# Patient Record
Sex: Female | Born: 1990 | Race: White | Hispanic: No | Marital: Married | Smoking: Current every day smoker
Health system: Southern US, Community
[De-identification: ages and names within clinical notes are randomized; demographics above are authoritative.]

## PROBLEM LIST (undated history)

## (undated) DIAGNOSIS — F32A Depression, unspecified: Secondary | ICD-10-CM

## (undated) DIAGNOSIS — Z3A40 40 weeks gestation of pregnancy: Secondary | ICD-10-CM

## (undated) HISTORY — DX: Depression, unspecified: F32.A

## (undated) HISTORY — PX: NO PAST SURGERIES: SHX2092

## (undated) HISTORY — DX: 40 weeks gestation of pregnancy: Z3A.40

---

## 2020-01-24 NOTE — L&D Delivery Note (Signed)
PREOPERATIVE DIAGNOSES: 1. Term pregnancy at [redacted]wks Gestational Age 30. Maternal Exhaustion    POSTOPERATIVE DIAGNOSES: 1. Term pregnancy at [redacted]wks Gestational Age 30. Live, Viable female infant 3. Maternal Exhaustion    OPERATION PERFORMED: Vacuum-Assisted Vaginal Delivery Repair partial  (<50%) 3rd degree laceration   SURGEON: Annamarie Major, MD   ANESTHESIA: Epidural   ESTIMATED BLOOD LOSS: 400 cc   FINDINGS: Delivered a female infant with Apgars 8 and 9, three-vessel cord and normal placenta.   COMPLICATIONS: None   SITUATION:  The options for labor management at his time were discussed with the patient and her partner, as were the delivery options including a Kiwi vacuum delivery. The pros and cons and the risks of the vacuum delivery were discussed in detail, as were the alternative approaches. The patient and her partner decided to proceed with a Kiwi vacuum delivery.    DESCRIPTION OF THE PROCEDURE:    The baby's head was confirmed to be in the ROA presentation with 100% effacement and +3 station. The bladder was drained. The vacuum was placed and the correct placement in front of the posterior fontanelle was confirmed digitally. With the patient's next contraction, the vacuum was inflated and a gentle pressure was used to assist with maternal pushes to deliver the baby's head. In total there were 1pulls and 0 pop-offs.   After delivery of the head, the vacuum was deflated and removed. There was no nuchal cord.  The anterior  shoulder was delivered with gentle downward guidance followed by delivery of the posterior  shoulder with gentle upward guidance. The infant was placed on the maternal chest.  The cord was clamped x2 and cut. The infant was handed to the NICU attendants.   Pitocin was added to the patient's IV fluids. The placenta delivered spontaneously, was intact and had a three-vessel cord. A vaginal inspection revealed 3rd (<50%) perineal lacetation. The laceration  was repaired  with #2-0Vicryl suture in a normal fashion with local anesthesia.    At the end of the delivery mom and baby were recovering in stable condition.  Sponge, instrument, and needle counts were correct times two.   Annamarie Major, MD, [facility], [facility] 10/15/2020  1:45 PM

## 2020-02-23 ENCOUNTER — Encounter: Payer: Self-pay | Admitting: Obstetrics and Gynecology

## 2020-02-23 ENCOUNTER — Ambulatory Visit (INDEPENDENT_AMBULATORY_CARE_PROVIDER_SITE_OTHER): Payer: 59 | Admitting: Obstetrics and Gynecology

## 2020-02-23 ENCOUNTER — Other Ambulatory Visit: Payer: Self-pay

## 2020-02-23 ENCOUNTER — Other Ambulatory Visit (HOSPITAL_COMMUNITY)
Admission: RE | Admit: 2020-02-23 | Discharge: 2020-02-23 | Disposition: A | Discharge status: Home / Self Care | Payer: 59 | Source: Ambulatory Visit | Attending: Obstetrics and Gynecology | Admitting: Obstetrics and Gynecology

## 2020-02-23 VITALS — BP 98/52 | HR 73 | Ht 62.0 in | Wt 120.0 lb

## 2020-02-23 DIAGNOSIS — Z124 Encounter for screening for malignant neoplasm of cervix: Secondary | ICD-10-CM | POA: Insufficient documentation

## 2020-02-23 DIAGNOSIS — Z7185 Encounter for immunization safety counseling: Secondary | ICD-10-CM

## 2020-02-23 DIAGNOSIS — N926 Irregular menstruation, unspecified: Secondary | ICD-10-CM | POA: Diagnosis not present

## 2020-02-23 DIAGNOSIS — Z3403 Encounter for supervision of normal first pregnancy, third trimester: Secondary | ICD-10-CM

## 2020-02-23 DIAGNOSIS — Z113 Encounter for screening for infections with a predominantly sexual mode of transmission: Secondary | ICD-10-CM

## 2020-02-23 DIAGNOSIS — O281 Abnormal biochemical finding on antenatal screening of mother: Secondary | ICD-10-CM

## 2020-02-23 DIAGNOSIS — Z3401 Encounter for supervision of normal first pregnancy, first trimester: Secondary | ICD-10-CM

## 2020-02-23 DIAGNOSIS — Z369 Encounter for antenatal screening, unspecified: Secondary | ICD-10-CM

## 2020-02-23 HISTORY — DX: Encounter for supervision of normal first pregnancy, third trimester: Z34.03

## 2020-02-23 LAB — POCT URINE PREGNANCY: Preg Test, Ur: POSITIVE — AB

## 2020-02-23 NOTE — Progress Notes (Signed)
New Obstetric Patient H&P    Chief Complaint: Missed period, + home pregnancy test   History of Present Illness: Patient is a 30 y.o. G1P0 Not Hispanic or Latino female, presents with amenorrhea and positive home pregnancy test. Patient's last menstrual period was 01/08/2020 (exact date). and based on her  LMP, her EDD is Estimated Date of Delivery: 10/14/20 and her EGA is [redacted]w[redacted]d. Cycles are 6 days, regular, and occur approximately every : 28-30 days. She has never had a pap smear before.   She had a urine pregnancy test which was positive 2 or 3 week(s)  ago. Her last menstrual period was normal and lasted for  6 or 7 day(s). Since her LMP she claims she has experienced fatigue. cramps and breast tenderness. She denies vaginal bleeding. Her past medical history is noncontributory. This is her first pregnancy.  Since her LMP, she admits to the use of tobacco products  Yes - daily vaping - has cut nicotine out She claims she has gained 8 pounds since the start of her pregnancy.  There are cats in the home in the home  yes If yes Indoor - educated regarding litterbox changing - patient stated understanding She admits close contact with children on a regular basis  yes  She has had chicken pox in the past no She has had Tuberculosis exposures, symptoms, or previously tested positive for TB   no Current or past history of domestic violence. no  Genetic Screening/Teratology Counseling: (Includes patient, baby's father, or anyone in either family with:)   1. Patient's age >/= 100 at [facility]  no 2. Thalassemia ([place], [place], Mediterranean, or Asian background): MCV<80  no 3. Neural tube defect (meningomyelocele, spina bifida, anencephaly)  no 4. Congenital heart defect  no  5. Down syndrome  no 6. Tay-Sachs (Jewish, [place]  no 7. Canavan's Disease  no 8. Sickle cell disease or trait (African)  no  9. Hemophilia or other blood disorders  no  10. Muscular dystrophy  no  11. Cystic  fibrosis  no  12. Huntington's Chorea  no  13. Mental retardation/autism  no 14. Other inherited genetic or chromosomal disorder  no 15. Maternal metabolic disorder (DM, PKU, etc)  no 16. Patient or FOB with a child with a birth defect not listed above no  16a. Patient or FOB with a birth defect themselves no 17. Recurrent pregnancy loss, or stillbirth  no  18. Any medications since LMP other than prenatal vitamins (include vitamins, supplements, OTC meds, drugs, alcohol)  no 19. Any other genetic/environmental exposure to discuss  no  Infection History:   1. Lives with someone with TB or TB exposed  no  2. Patient or partner has history of genital herpes  no 3. Rash or viral illness since LMP  no 4. History of STI (GC, CT, HPV, syphilis, HIV)  no 5. History of recent travel :  no  Other pertinent information:  Employed at [employer]  Lives with husband, "Patricia Nixon," and his parents.    Review of Systems:10 point review of systems negative unless otherwise noted in HPI  Past Medical History:  Patient Active Problem List   Diagnosis Date Noted  . Encounter for supervision of normal first pregnancy in first trimester 02/23/2020     Nursing Staff Provider  Office Location  [facility] Dating   LMP  Language  English Anatomy US    Flu Vaccine   decline Genetic Screen  NIPS:  Pt confirming insurance coverage  TDaP vaccine    Hgb A1C or  GTT Third trimester :   Rhogam     LAB RESULTS   Feeding Plan  breast Blood Type     Contraception  Antibody    Circumcision  Rubella    Pediatrician   RPR     Support Person  HBsAg     Prenatal Classes  HIV      Varicella   BTL Consent  GBS  (For PCN allergy, check sensitivities)        VBAC Consent  Pap  collected 02/23/20    Hgb Electro   n/a  Covid Vaccine Counseled -declined CF      SMA              Past Surgical History:  History reviewed. No pertinent surgical history.  Gynecologic History: Patient's last menstrual period  was 01/08/2020 (exact date).  Obstetric History: G1P0  Family History:  History reviewed. No pertinent family history.  Social History:  Social History   Socioeconomic History  . Marital status: Married    Spouse name: Not on file  . Number of children: Not on file  . Years of education: Not on file  . Highest education level: Not on file  Occupational History  . Not on file  Tobacco Use  . Smoking status: Not on file  . Smokeless tobacco: Not on file  Substance and Sexual Activity  . Alcohol use: Not on file  . Drug use: Not on file  . Sexual activity: Not on file  Other Topics Concern  . Not on file  Social History Narrative  . Not on file   Social Determinants of Health   Financial Resource Strain: Not on file  Food Insecurity: Not on file  Transportation Needs: Not on file  Physical Activity: Not on file  Stress: Not on file  Social Connections: Not on file  Intimate Partner Violence: Not on file    Allergies:  No Known Allergies  Medications: Prior to Admission medications   Medication Sig Start Date End Date Taking? Authorizing Provider  Prenatal Vit-Fe Fumarate-FA (MULTIVITAMIN-PRENATAL) 27-0.8 MG TABS tablet Take 1 tablet by mouth daily at 12 noon.   Yes [provider]    Physical Exam Vitals: Blood pressure (!) 98/52, pulse 73, height 5\' 2"  (1.575 m), weight 120 lb (54.4 kg), last menstrual period 01/08/2020.  General: NAD HEENT: normocephalic, anicteric Thyroid: no enlargement, no palpable nodules Pulmonary: No increased work of breathing, CTAB Cardiovascular: RRR, distal pulses 2+ Abdomen: NABS, soft, non-tender, non-distended.  Umbilicus without lesions.  No hepatomegaly, splenomegaly or masses palpable. No evidence of hernia  Genitourinary:  External: Normal external female genitalia.  Normal urethral meatus, normal  Bartholin's and Skene's glands.    Vagina: Normal vaginal mucosa, no evidence of prolapse.    Cervix: Grossly normal  in appearance, no bleeding  Uterus: Enlarged (size c/w with 6-8 week dates), mobile, normal contour.  No CMT  Adnexa: ovaries non-enlarged, no adnexal masses  Rectal: deferred Extremities: no edema, erythema, or tenderness Neurologic: Grossly intact Psychiatric: mood appropriate, affect full   Assessment: 31 y.o. G1P0 at [redacted]w[redacted]d presenting to initiate prenatal care  Plan: 1) Avoid alcoholic beverages. 2) Patient encouraged not to smoke.  3) Discontinue the use of all non-medicinal drugs and chemicals.  4) Take prenatal vitamins daily.  5) Nutrition, food safety (fish, cheese advisories, and high nitrite foods) and exercise discussed. 6) Hospital and practice style discussed with cross coverage system.  7)  Genetic Screening, such as with 1st Trimester Screening, cell free fetal DNA, AFP testing, and Ultrasound, as well as with amniocentesis and CVS as appropriate, is discussed with patient. At the conclusion of today's visit patient plans to reach out to insurance regarding genetic testing coverage  8) NOB labs/urine/pap/gc/ct collected today  9) RTC in 2 weeks for ROB with dating [place]   Patricia Nixon, CNM, MSN [facility], [facility] 02/23/2020, 3:53 PM

## 2020-02-24 LAB — URINE DRUG PANEL 7
Amphetamines, Urine: NEGATIVE ng/mL
Barbiturate Quant, Ur: NEGATIVE ng/mL
Benzodiazepine Quant, Ur: NEGATIVE ng/mL
Cannabinoid Quant, Ur: NEGATIVE ng/mL
Cocaine (Metab.): NEGATIVE ng/mL
Opiate Quant, Ur: NEGATIVE ng/mL
PCP Quant, Ur: NEGATIVE ng/mL

## 2020-02-25 LAB — RPR+RH+ABO+RUB AB+AB SCR+CB...
Antibody Screen: NEGATIVE
HIV Screen 4th Generation wRfx: NONREACTIVE
Hematocrit: 33.2 % — ABNORMAL LOW (ref 34.0–46.6)
Hemoglobin: 11.5 g/dL (ref 11.1–15.9)
Hepatitis B Surface Ag: NEGATIVE
MCH: 31.7 pg (ref 26.6–33.0)
MCHC: 34.6 g/dL (ref 31.5–35.7)
MCV: 92 fL (ref 79–97)
Platelets: 325 10*3/uL (ref 150–450)
RBC: 3.63 x10E6/uL — ABNORMAL LOW (ref 3.77–5.28)
RDW: 13 % (ref 11.7–15.4)
RPR Ser Ql: NONREACTIVE
Rh Factor: POSITIVE
Rubella Antibodies, IGG: 1.35 {index}
Varicella zoster IgG: 240 {index}
WBC: 10.6 10*3/uL (ref 3.4–10.8)

## 2020-02-25 LAB — URINE CULTURE

## 2020-02-26 LAB — CYTOLOGY - PAP
Adequacy: ABSENT
Chlamydia: NEGATIVE
Comment: NEGATIVE
Comment: NEGATIVE
Comment: NORMAL
Diagnosis: NEGATIVE
High risk HPV: NEGATIVE
Neisseria Gonorrhea: NEGATIVE

## 2020-03-02 ENCOUNTER — Ambulatory Visit (INDEPENDENT_AMBULATORY_CARE_PROVIDER_SITE_OTHER): Payer: 59 | Admitting: Obstetrics

## 2020-03-02 ENCOUNTER — Other Ambulatory Visit: Payer: Self-pay

## 2020-03-02 ENCOUNTER — Ambulatory Visit (INDEPENDENT_AMBULATORY_CARE_PROVIDER_SITE_OTHER): Payer: 59

## 2020-03-02 ENCOUNTER — Other Ambulatory Visit: Payer: Self-pay | Admitting: Obstetrics and Gynecology

## 2020-03-02 VITALS — BP 102/60 | Wt 120.0 lb

## 2020-03-02 DIAGNOSIS — Z3A01 Less than 8 weeks gestation of pregnancy: Secondary | ICD-10-CM

## 2020-03-02 DIAGNOSIS — N926 Irregular menstruation, unspecified: Secondary | ICD-10-CM | POA: Diagnosis not present

## 2020-03-02 DIAGNOSIS — Z3401 Encounter for supervision of normal first pregnancy, first trimester: Secondary | ICD-10-CM

## 2020-03-02 NOTE — Progress Notes (Signed)
No vb. No lof.  

## 2020-03-02 NOTE — Progress Notes (Signed)
  Routine Prenatal Care Visit  Subjective  Patricia Nixon is a 30 y.o. G1P0 at [redacted]w[redacted]d being seen today for ongoing prenatal care.  She is currently monitored for the following issues for this low-risk pregnancy and has Encounter for supervision of normal first pregnancy in first trimester on their problem list.  ----------------------------------------------------------------------------------- Patient reports no complaints.    . Vag. Bleeding: None.   . Leaking Fluid denies.  ----------------------------------------------------------------------------------- The following portions of the patient's history were reviewed and updated as appropriate: allergies, current medications, past family history, past medical history, past social history, past surgical history and problem list. Problem list updated.  Objective  Blood pressure 102/60, weight 120 lb (54.4 kg), last menstrual period 01/08/2020. Pregravid weight 112 lb (50.8 kg) Total Weight Gain 8 lb (3.629 kg) Urinalysis: Urine Protein    Urine Glucose    Fetal Status:           General:  Alert, oriented and cooperative. Patient is in no acute distress.  Skin: Skin is warm and dry. No rash noted.   Cardiovascular: Normal heart rate noted  Respiratory: Normal respiratory effort, no problems with respiration noted  Abdomen: Soft, gravid, appropriate for gestational age. Pain/Pressure: Absent     Pelvic:  Cervical exam deferred        Extremities: Normal range of motion.     Mental Status: Normal mood and affect. Normal behavior. Normal judgment and thought content.   Assessment   30 y.o. G1P0 at [redacted]w[redacted]d by  10/14/2020, by Last Menstrual Period presenting for routine prenatal visit  Plan   pregnancy 1 Problems (from 02/23/20 to present)    Problem Noted Resolved   Encounter for supervision of normal first pregnancy in first trimester 02/23/2020 by Zipporah Plants, CNM No   Overview Addendum 02/25/2020  2:25 PM by Zipporah Plants, CNM     Nursing  Staff Provider  Office Location  [facility] Dating   LMP  Language  English Anatomy US    Flu Vaccine   decline Genetic Screen  NIPS:  Pt confirming insurance coverage   TDaP vaccine    Hgb A1C or  GTT Third trimester :   Rhogam     LAB RESULTS   Feeding Plan  breast Blood Type   A +  Contraception  Antibody   neg  Circumcision  Rubella   immune  Pediatrician   RPR   non-reactive  Support Person  HBsAg   negative  Prenatal Classes  HIV   non-reactive    Varicella  immune  BTL Consent  GBS  (For PCN allergy, check sensitivities)        VBAC Consent  Pap  collected 02/23/20    Hgb Electro   n/a  Covid Vaccine Counseled -declined CF      SMA                Previous Version       Preterm labor symptoms and general obstetric precautions including but not limited to vaginal bleeding, contractions, leaking of fluid and fetal movement were reviewed in detail with the patient. Please refer to After Visit Summary for other counseling recommendations.   Return in about 4 weeks (around 03/30/2020) for return OB.  Mirna Mires, CNM  03/02/2020 2:11 PM

## 2020-03-30 ENCOUNTER — Other Ambulatory Visit: Payer: Self-pay

## 2020-03-30 ENCOUNTER — Ambulatory Visit (INDEPENDENT_AMBULATORY_CARE_PROVIDER_SITE_OTHER): Payer: 59 | Admitting: Obstetrics

## 2020-03-30 VITALS — BP 118/74 | Wt 128.0 lb

## 2020-03-30 DIAGNOSIS — Z3401 Encounter for supervision of normal first pregnancy, first trimester: Secondary | ICD-10-CM

## 2020-03-30 DIAGNOSIS — Z3A11 11 weeks gestation of pregnancy: Secondary | ICD-10-CM

## 2020-03-30 NOTE — Progress Notes (Signed)
  Routine Prenatal Care Visit  Subjective  Patricia Nixon is a 30 y.o. G1P0 at [redacted]w[redacted]d being seen today for ongoing prenatal care.  She is currently monitored for the following issues for this low-risk pregnancy and has Encounter for supervision of normal first pregnancy in first trimester on their problem list.  ----------------------------------------------------------------------------------- Patient reports no complaints.    . Vag. Bleeding: None.   . Leaking Fluid denies.  ----------------------------------------------------------------------------------- The following portions of the patient's history were reviewed and updated as appropriate: allergies, current medications, past family history, past medical history, past social history, past surgical history and problem list. Problem list updated.  Objective  Blood pressure 118/74, weight 128 lb (58.1 kg), last menstrual period 01/08/2020. Pregravid weight 112 lb (50.8 kg) Total Weight Gain 16 lb (7.258 kg) Urinalysis: Urine Protein    Urine Glucose    Fetal Status:           General:  Alert, oriented and cooperative. Patient is in no acute distress.  Skin: Skin is warm and dry. No rash noted.   Cardiovascular: Normal heart rate noted  Respiratory: Normal respiratory effort, no problems with respiration noted  Abdomen: Soft, gravid, appropriate for gestational age. Pain/Pressure: Absent     Pelvic:  Cervical exam deferred        Extremities: Normal range of motion.  Edema: None  Mental Status: Normal mood and affect. Normal behavior. Normal judgment and thought content.   Assessment   30 y.o. G1P0 at [redacted]w[redacted]d by  10/14/2020, by Last Menstrual Period presenting for routine prenatal visit  Plan   pregnancy 1 Problems (from 02/23/20 to present)    Problem Noted Resolved   Encounter for supervision of normal first pregnancy in first trimester 02/23/2020 by Patricia Nixon, CNM No   Overview Addendum 02/25/2020  2:25 PM by Patricia Nixon, CNM      Nursing Staff Provider  Office Location  [facility] Dating   LMP  Language  English Anatomy US    Flu Vaccine   decline Genetic Screen  NIPS:  Pt confirming insurance coverage   TDaP vaccine    Hgb A1C or  GTT Third trimester :   Rhogam     LAB RESULTS   Feeding Plan  breast Blood Type   A +  Contraception  Antibody   neg  Circumcision  Rubella   immune  Pediatrician   RPR   non-reactive  Support Person  HBsAg   negative  Prenatal Classes  HIV   non-reactive    Varicella  immune  BTL Consent  GBS  (For PCN allergy, check sensitivities)        VBAC Consent  Pap  collected 02/23/20    Hgb Electro   n/a  Covid Vaccine Counseled -declined CF      SMA                Previous Version       Preterm labor symptoms and general obstetric precautions including but not limited to vaginal bleeding, contractions, leaking of fluid and fetal movement were reviewed in detail with the patient. Please refer to After Visit Summary for other counseling recommendations.  She has decided not to proced with genetic testing.  Return in about 4 weeks (around 04/27/2020) for return OB.  Patricia Nixon, CNM  03/30/2020 3:23 PM

## 2020-03-30 NOTE — Progress Notes (Signed)
No vb. No lof.  

## 2020-04-27 ENCOUNTER — Ambulatory Visit (INDEPENDENT_AMBULATORY_CARE_PROVIDER_SITE_OTHER): Payer: 59 | Admitting: Obstetrics & Gynecology

## 2020-04-27 ENCOUNTER — Other Ambulatory Visit: Payer: Self-pay

## 2020-04-27 ENCOUNTER — Encounter: Payer: Self-pay | Admitting: Obstetrics & Gynecology

## 2020-04-27 VITALS — BP 100/70 | Wt 132.0 lb

## 2020-04-27 DIAGNOSIS — Z3402 Encounter for supervision of normal first pregnancy, second trimester: Secondary | ICD-10-CM

## 2020-04-27 DIAGNOSIS — Z3A15 15 weeks gestation of pregnancy: Secondary | ICD-10-CM

## 2020-04-27 DIAGNOSIS — Z3689 Encounter for other specified antenatal screening: Secondary | ICD-10-CM

## 2020-04-27 NOTE — Progress Notes (Signed)
  Subjective  Min nausea or pain  Objective  BP 100/70   Wt 132 lb (59.9 kg)   LMP 01/08/2020 (Exact Date)   BMI 24.14 kg/m  General: NAD Pumonary: no increased work of breathing Abdomen: gravid, non-tender Extremities: no edema Psychiatric: mood appropriate, affect full  Assessment  30 y.o. G1P0 at [redacted]w[redacted]d by  10/14/2020, by Last Menstrual Period presenting for routine prenatal visit  Plan   Problem List Items Addressed This Visit      Other   Encounter for supervision of normal first pregnancy in first trimester - Primary    Other Visit Diagnoses    [redacted] weeks gestation of pregnancy          pregnancy 1 Problems (from 02/23/20 to present)    Problem Noted Resolved   Encounter for supervision of normal first pregnancy in first trimester 02/23/2020 by Zipporah Plants, CNM No   Overview Addendum 04/27/2020 10:23 AM by Nadara Mustard, MD     Nursing Staff Provider  Office Location  [facility] Dating   LMP  Language  English Anatomy US  scheduled  Flu Vaccine   decline Genetic Screen  NIPS: decline   TDaP vaccine    Hgb A1C or  GTT Third trimester :   Rhogam   n/a   LAB RESULTS   Feeding Plan  breast Blood Type   A +  Contraception  Antibody   neg  Circumcision  Rubella   immune  Pediatrician   RPR   non-reactive  Support Person  HBsAg   negative  Prenatal Classes  HIV   non-reactive    Varicella  immune  BTL Consent no GBS  (For PCN allergy, check sensitivities)        VBAC Consent n/a Pap  collected 02/23/20    Hgb Electro   n/a  Covid Vaccine Counseled -declined                     Annamarie Major, MD, [facility], [facility] 04/27/2020  10:35 AM

## 2020-04-27 NOTE — Patient Instructions (Signed)
Thank you for choosing [facility]. As part of our ongoing efforts to improve patient experience, we would appreciate your feedback. Please fill out the short survey that you will receive by mail or MyChart. Your opinion is important to us! -Dr Yanira Tolsma  Second Trimester of Pregnancy  The second trimester of pregnancy is from week 13 through week 27. This is months 4 through 6 of pregnancy. The second trimester is often a time when you feel your best. Your body has adjusted to being pregnant, and you begin to feel better physically. During the second trimester:  Morning sickness has lessened or stopped completely.  You may have more energy.  You may have an increase in appetite. The second trimester is also a time when the unborn baby (fetus) is growing rapidly. At the end of the sixth month, the fetus may be up to 12 inches long and weigh about 1 pounds. You will likely begin to feel the baby move (quickening) between 16 and 20 weeks of pregnancy. Body changes during your second trimester Your body continues to go through many changes during your second trimester. The changes vary and generally return to normal after the baby is born. Physical changes  Your weight will continue to increase. You will notice your lower abdomen bulging out.  You may begin to get stretch marks on your hips, abdomen, and breasts.  Your breasts will continue to grow and to become tender.  Dark spots or blotches (chloasma or mask of pregnancy) may develop on your face.  A dark line from your belly button to the pubic area (linea nigra) may appear.  You may have changes in your hair. These can include thickening of your hair, rapid growth, and changes in texture. Some people also have hair loss during or after pregnancy, or hair that feels dry or thin. Health changes  You may develop headaches.  You may have heartburn.  You may develop constipation.  You may develop hemorrhoids or swollen, bulging  veins (varicose veins).  Your gums may bleed and may be sensitive to brushing and flossing.  You may urinate more often because the fetus is pressing on your bladder.  You may have back pain. This is caused by: ? Weight gain. ? Pregnancy hormones that are relaxing the joints in your pelvis. ? A shift in weight and the muscles that support your balance. Follow these instructions at home: Medicines  Follow your health care provider's instructions regarding medicine use. Specific medicines may be either safe or unsafe to take during pregnancy. Do not take any medicines unless approved by your health care provider.  Take a prenatal vitamin that contains at least 600 micrograms (mcg) of folic acid. Eating and drinking  Eat a healthy diet that includes fresh fruits and vegetables, whole grains, good sources of protein such as meat, eggs, or tofu, and low-fat dairy products.  Avoid raw meat and unpasteurized juice, milk, and cheese. These carry germs that can harm you and your baby.  You may need to take these actions to prevent or treat constipation: ? Drink enough fluid to keep your urine pale yellow. ? Eat foods that are high in fiber, such as beans, whole grains, and fresh fruits and vegetables. ? Limit foods that are high in fat and processed sugars, such as fried or sweet foods. Activity  Exercise only as directed by your health care provider. Most people can continue their usual exercise routine during pregnancy. Try to exercise for 30 minutes at least   5 days a week. Stop exercising if you develop contractions in your uterus.  Stop exercising if you develop pain or cramping in the lower abdomen or lower back.  Avoid exercising if it is very hot or humid or if you are at a high altitude.  Avoid heavy lifting.  If you choose to, you may have sex unless your health care provider tells you not to. Relieving pain and discomfort  Wear a supportive bra to prevent discomfort from  breast tenderness.  Take warm sitz baths to soothe any pain or discomfort caused by hemorrhoids. Use hemorrhoid cream if your health care provider approves.  Rest with your legs raised (elevated) if you have leg cramps or low back pain.  If you develop varicose veins: ? Wear support hose as told by your health care provider. ? Elevate your feet for 15 minutes, 3-4 times a day. ? Limit salt in your diet. Safety  Wear your seat belt at all times when driving or riding in a car.  Talk with your health care provider if someone is verbally or physically abusive to you. Lifestyle  Do not use hot tubs, steam rooms, or saunas.  Do not douche. Do not use tampons or scented sanitary pads.  Avoid cat litter boxes and soil used by cats. These carry germs that can cause birth defects in the baby and possibly loss of the fetus by miscarriage or stillbirth.  Do not use herbal remedies, alcohol, illegal drugs, or medicines that are not approved by your health care provider. Chemicals in these products can harm your baby.  Do not use any products that contain nicotine or tobacco, such as cigarettes, e-cigarettes, and chewing tobacco. If you need help quitting, ask your health care provider. General instructions  During a routine prenatal visit, your health care provider will do a physical exam and other tests. He or she will also discuss your overall health. Keep all follow-up visits. This is important.  Ask your health care provider for a referral to a local prenatal education class.  Ask for help if you have counseling or nutritional needs during pregnancy. Your health care provider can offer advice or refer you to specialists for help with various needs. Where to find more information  American Pregnancy Association: americanpregnancy.org  American College of Obstetricians and Gynecologists: acog.org/en/Womens%20Health/Pregnancy  Office on Women's Health: womenshealth.gov/pregnancy Contact  a health care provider if you have:  A headache that does not go away when you take medicine.  Vision changes or you see spots in front of your eyes.  Mild pelvic cramps, pelvic pressure, or nagging pain in the abdominal area.  Persistent nausea, vomiting, or diarrhea.  A bad-smelling vaginal discharge or foul-smelling urine.  Pain when you urinate.  Sudden or extreme swelling of your face, hands, ankles, feet, or legs.  A fever. Get help right away if you:  Have fluid leaking from your vagina.  Have spotting or bleeding from your vagina.  Have severe abdominal cramping or pain.  Have difficulty breathing.  Have chest pain.  Have fainting spells.  Have not felt your baby move for the time period told by your health care provider.  Have new or increased pain, swelling, or redness in an arm or leg. Summary  The second trimester of pregnancy is from week 13 through week 27 (months 4 through 6).  Do not use herbal remedies, alcohol, illegal drugs, or medicines that are not approved by your health care provider. Chemicals in these products can   harm your baby.  Exercise only as directed by your health care provider. Most people can continue their usual exercise routine during pregnancy.  Keep all follow-up visits. This is important. This information is not intended to replace advice given to you by your health care provider. Make sure you discuss any questions you have with your health care provider. Document Revised: 06/18/2019 Document Reviewed: 04/24/2019 Elsevier Patient Education  2021 Elsevier Inc.  

## 2020-06-02 ENCOUNTER — Other Ambulatory Visit: Payer: Self-pay

## 2020-06-02 ENCOUNTER — Ambulatory Visit
Admission: RE | Admit: 2020-06-02 | Discharge: 2020-06-02 | Disposition: A | Discharge status: Home / Self Care | Payer: Medicaid Other | Source: Ambulatory Visit | Attending: Obstetrics & Gynecology | Admitting: Obstetrics & Gynecology

## 2020-06-02 DIAGNOSIS — Z3689 Encounter for other specified antenatal screening: Secondary | ICD-10-CM | POA: Diagnosis not present

## 2020-06-02 DIAGNOSIS — Z3A21 21 weeks gestation of pregnancy: Secondary | ICD-10-CM | POA: Diagnosis not present

## 2020-06-03 ENCOUNTER — Encounter: Payer: Self-pay | Admitting: Advanced Practice Midwife

## 2020-06-03 ENCOUNTER — Ambulatory Visit (INDEPENDENT_AMBULATORY_CARE_PROVIDER_SITE_OTHER): Payer: Medicaid Other | Admitting: Advanced Practice Midwife

## 2020-06-03 VITALS — BP 120/80 | Wt 135.0 lb

## 2020-06-03 DIAGNOSIS — Z3A21 21 weeks gestation of pregnancy: Secondary | ICD-10-CM

## 2020-06-03 DIAGNOSIS — Z3402 Encounter for supervision of normal first pregnancy, second trimester: Secondary | ICD-10-CM

## 2020-06-03 LAB — POCT URINALYSIS DIPSTICK OB
Glucose, UA: NEGATIVE
POC,PROTEIN,UA: NEGATIVE

## 2020-06-03 NOTE — Progress Notes (Addendum)
  Routine Prenatal Care Visit  Subjective  Patricia Nixon is a 30 y.o. G1P0 at [redacted]w[redacted]d being seen today for ongoing prenatal care.  She is currently monitored for the following issues for this low-risk pregnancy and has Encounter for supervision of normal first pregnancy in first trimester on their problem list.  ----------------------------------------------------------------------------------- Patient reports no complaints.   Contractions: Not present. Vag. Bleeding: None.  Movement: Present. Leaking Fluid denies.  ----------------------------------------------------------------------------------- The following portions of the patient's history were reviewed and updated as appropriate: allergies, current medications, past family history, past medical history, past social history, past surgical history and problem list. Problem list updated.  Objective  Blood pressure 120/80, weight 135 lb (61.2 kg), last menstrual period 01/08/2020. Pregravid weight 112 lb (50.8 kg) Total Weight Gain 23 lb (10.4 kg) Urinalysis: Urine Protein    Urine Glucose    Fetal Status: Fetal Heart Rate (bpm): 153 Fundal Height: 21 cm Movement: Present      Anatomy scan done 06/02/2020: complete, normal, female, cephalic, placenta posterior  General:  Alert, oriented and cooperative. Patient is in no acute distress.  Skin: Skin is warm and dry. No rash noted.   Cardiovascular: Normal heart rate noted  Respiratory: Normal respiratory effort, no problems with respiration noted  Abdomen: Soft, gravid, appropriate for gestational age. Pain/Pressure: Absent     Pelvic:  Cervical exam deferred        Extremities: Normal range of motion.  Edema: None  Mental Status: Normal mood and affect. Normal behavior. Normal judgment and thought content.   Assessment   30 y.o. G1P0 at [redacted]w[redacted]d by  10/14/2020, by Last Menstrual Period presenting for routine prenatal visit  Plan   pregnancy 1 Problems (from 02/23/20 to present)    Problem  Noted Resolved   Encounter for supervision of normal first pregnancy in first trimester 02/23/2020 by Zipporah Plants, CNM No   Overview Addendum 04/27/2020 10:23 AM by Nadara Mustard, MD     Nursing Staff Provider  Office Location  [facility] Dating   LMP  Language  English Anatomy US  scheduled  Flu Vaccine   decline Genetic Screen  NIPS: decline   TDaP vaccine    Hgb A1C or  GTT Third trimester :   Rhogam   n/a   LAB RESULTS   Feeding Plan  breast Blood Type   A +  Contraception  Antibody   neg  Circumcision  Rubella   immune  Pediatrician   RPR   non-reactive  Support Person  HBsAg   negative  Prenatal Classes  HIV   non-reactive    Varicella  immune  BTL Consent no GBS  (For PCN allergy, check sensitivities)        VBAC Consent n/a Pap  collected 02/23/20    Hgb Electro   n/a  Covid Vaccine Counseled -declined                       Previous Version       Preterm labor symptoms and general obstetric precautions including but not limited to vaginal bleeding, contractions, leaking of fluid and fetal movement were reviewed in detail with the patient.    Return in about 4 weeks (around 07/01/2020) for rob.  Tresea Mall, CNM 06/03/2020 4:32 PM

## 2020-07-01 ENCOUNTER — Ambulatory Visit (INDEPENDENT_AMBULATORY_CARE_PROVIDER_SITE_OTHER): Payer: Medicaid Other | Admitting: Advanced Practice Midwife

## 2020-07-01 ENCOUNTER — Encounter: Payer: Self-pay | Admitting: Advanced Practice Midwife

## 2020-07-01 ENCOUNTER — Other Ambulatory Visit: Payer: Self-pay

## 2020-07-01 VITALS — BP 122/76 | Wt 142.0 lb

## 2020-07-01 DIAGNOSIS — Z3A25 25 weeks gestation of pregnancy: Secondary | ICD-10-CM

## 2020-07-01 DIAGNOSIS — Z113 Encounter for screening for infections with a predominantly sexual mode of transmission: Secondary | ICD-10-CM

## 2020-07-01 DIAGNOSIS — Z3402 Encounter for supervision of normal first pregnancy, second trimester: Secondary | ICD-10-CM

## 2020-07-01 DIAGNOSIS — Z131 Encounter for screening for diabetes mellitus: Secondary | ICD-10-CM

## 2020-07-01 DIAGNOSIS — Z369 Encounter for antenatal screening, unspecified: Secondary | ICD-10-CM

## 2020-07-01 DIAGNOSIS — Z130 Encounter for screening for diseases of the blood and blood-forming organs and certain disorders involving the immune mechanism: Secondary | ICD-10-CM

## 2020-07-01 LAB — POCT URINALYSIS DIPSTICK OB
Glucose, UA: NEGATIVE
POC,PROTEIN,UA: NEGATIVE

## 2020-07-01 NOTE — Progress Notes (Signed)
ROB - no concerns. RM 5 

## 2020-07-01 NOTE — Progress Notes (Signed)
  Routine Prenatal Care Visit  Subjective  Patricia Nixon is a 30 y.o. G1P0 at [redacted]w[redacted]d being seen today for ongoing prenatal care.  She is currently monitored for the following issues for this low-risk pregnancy and has Encounter for supervision of normal first pregnancy in first trimester on their problem list.  ----------------------------------------------------------------------------------- Patient reports no complaints.   Contractions: Not present. Vag. Bleeding: None.  Movement: Present. Leaking Fluid denies.  ----------------------------------------------------------------------------------- The following portions of the patient's history were reviewed and updated as appropriate: allergies, current medications, past family history, past medical history, past social history, past surgical history and problem list. Problem list updated.  Objective  Blood pressure 122/76, weight 142 lb (64.4 kg), last menstrual period 01/08/2020. Pregravid weight 112 lb (50.8 kg) Total Weight Gain 30 lb (13.6 kg) Urinalysis: Urine Protein Negative  Urine Glucose Negative  Fetal Status: Fetal Heart Rate (bpm): 146 Fundal Height: 25 cm Movement: Present     General:  Alert, oriented and cooperative. Patient is in no acute distress.  Skin: Skin is warm and dry. No rash noted.   Cardiovascular: Normal heart rate noted  Respiratory: Normal respiratory effort, no problems with respiration noted  Abdomen: Soft, gravid, appropriate for gestational age. Pain/Pressure: Absent     Pelvic:  Cervical exam deferred        Extremities: Normal range of motion.  Edema: None  Mental Status: Normal mood and affect. Normal behavior. Normal judgment and thought content.   Assessment   30 y.o. G1P0 at [redacted]w[redacted]d by  10/14/2020, by Last Menstrual Period presenting for routine prenatal visit  Plan   pregnancy 1 Problems (from 02/23/20 to present)    Problem Noted Resolved   Encounter for supervision of normal first pregnancy in  first trimester 02/23/2020 by Zipporah Plants, CNM No   Overview Addendum 04/27/2020 10:23 AM by Nadara Mustard, MD     Nursing Staff Provider  Office Location  [facility] Dating   LMP  Language  English Anatomy US  scheduled  Flu Vaccine   decline Genetic Screen  NIPS: decline   TDaP vaccine    Hgb A1C or  GTT Third trimester :   Rhogam   n/a   LAB RESULTS   Feeding Plan  breast Blood Type   A +  Contraception  Antibody   neg  Circumcision  Rubella   immune  Pediatrician   RPR   non-reactive  Support Person  HBsAg   negative  Prenatal Classes  HIV   non-reactive    Varicella  immune  BTL Consent no GBS  (For PCN allergy, check sensitivities)        VBAC Consent n/a Pap  collected 02/23/20    Hgb Electro   n/a  Covid Vaccine Counseled -declined                            Preterm labor symptoms and general obstetric precautions including but not limited to vaginal bleeding, contractions, leaking of fluid and fetal movement were reviewed in detail with the patient.   Return in about 3 weeks (around 07/22/2020) for 28 wk labs and rob.  Tresea Mall, CNM 07/01/2020 3:23 PM

## 2020-07-22 ENCOUNTER — Encounter: Payer: Self-pay | Admitting: Advanced Practice Midwife

## 2020-07-22 ENCOUNTER — Ambulatory Visit (INDEPENDENT_AMBULATORY_CARE_PROVIDER_SITE_OTHER): Payer: Medicaid Other | Admitting: Advanced Practice Midwife

## 2020-07-22 ENCOUNTER — Encounter: Payer: Medicaid Other | Admitting: Obstetrics & Gynecology

## 2020-07-22 ENCOUNTER — Other Ambulatory Visit: Payer: Medicaid Other

## 2020-07-22 ENCOUNTER — Other Ambulatory Visit: Payer: Self-pay

## 2020-07-22 VITALS — BP 100/60 | Wt 146.0 lb

## 2020-07-22 DIAGNOSIS — Z369 Encounter for antenatal screening, unspecified: Secondary | ICD-10-CM | POA: Diagnosis not present

## 2020-07-22 DIAGNOSIS — Z130 Encounter for screening for diseases of the blood and blood-forming organs and certain disorders involving the immune mechanism: Secondary | ICD-10-CM

## 2020-07-22 DIAGNOSIS — Z131 Encounter for screening for diabetes mellitus: Secondary | ICD-10-CM

## 2020-07-22 DIAGNOSIS — Z3A28 28 weeks gestation of pregnancy: Secondary | ICD-10-CM

## 2020-07-22 DIAGNOSIS — Z3402 Encounter for supervision of normal first pregnancy, second trimester: Secondary | ICD-10-CM

## 2020-07-22 DIAGNOSIS — Z3403 Encounter for supervision of normal first pregnancy, third trimester: Secondary | ICD-10-CM

## 2020-07-22 DIAGNOSIS — Z113 Encounter for screening for infections with a predominantly sexual mode of transmission: Secondary | ICD-10-CM

## 2020-07-22 LAB — POCT URINALYSIS DIPSTICK OB
Glucose, UA: NEGATIVE
POC,PROTEIN,UA: NEGATIVE

## 2020-07-22 NOTE — Progress Notes (Signed)
ROB- 1 hr GTT, no concerns 

## 2020-07-22 NOTE — Progress Notes (Signed)
  Routine Prenatal Care Visit  Subjective  Patricia Nixon is a 30 y.o. G1P0 at [redacted]w[redacted]d being seen today for ongoing prenatal care.  She is currently monitored for the following issues for this low-risk pregnancy and has Encounter for supervision of normal first pregnancy in first trimester on their problem list.  ----------------------------------------------------------------------------------- Patient reports feeling tired today. She didn't get much sleep due to new puppies in the house.   Contractions: Not present. Vag. Bleeding: None.  Movement: Present. Leaking Fluid denies.  ----------------------------------------------------------------------------------- The following portions of the patient's history were reviewed and updated as appropriate: allergies, current medications, past family history, past medical history, past social history, past surgical history and problem list. Problem list updated.  Objective  Blood pressure 100/60, weight 146 lb (66.2 kg), last menstrual period 01/08/2020. Pregravid weight 112 lb (50.8 kg) Total Weight Gain 34 lb (15.4 kg) Urinalysis: Urine Protein Negative  Urine Glucose Negative  Fetal Status: Fetal Heart Rate (bpm): 140 Fundal Height: 28 cm Movement: Present     General:  Alert, oriented and cooperative. Patient is in no acute distress.  Skin: Skin is warm and dry. No rash noted.   Cardiovascular: Normal heart rate noted  Respiratory: Normal respiratory effort, no problems with respiration noted  Abdomen: Soft, gravid, appropriate for gestational age. Pain/Pressure: Absent     Pelvic:  Cervical exam deferred        Extremities: Normal range of motion.  Edema: None  Mental Status: Normal mood and affect. Normal behavior. Normal judgment and thought content.   Assessment   30 y.o. G1P0 at [redacted]w[redacted]d by  10/14/2020, by Last Menstrual Period presenting for routine prenatal visit  Plan   pregnancy 1 Problems (from 02/23/20 to present)    Problem Noted  Resolved   Encounter for supervision of normal first pregnancy in first trimester 02/23/2020 by Zipporah Plants, CNM No   Overview Addendum 04/27/2020 10:23 AM by Nadara Mustard, MD     Nursing Staff Provider  Office Location  [facility] Dating   LMP  Language  English Anatomy US  scheduled  Flu Vaccine   decline Genetic Screen  NIPS: decline   TDaP vaccine    Hgb A1C or  GTT Third trimester :   Rhogam   n/a   LAB RESULTS   Feeding Plan  breast Blood Type   A +  Contraception  Antibody   neg  Circumcision  Rubella   immune  Pediatrician   RPR   non-reactive  Support Person  HBsAg   negative  Prenatal Classes  HIV   non-reactive    Varicella  immune  BTL Consent no GBS  (For PCN allergy, check sensitivities)        VBAC Consent n/a Pap  collected 02/23/20    Hgb Electro   n/a  Covid Vaccine Counseled -declined                            Preterm labor symptoms and general obstetric precautions including but not limited to vaginal bleeding, contractions, leaking of fluid and fetal movement were reviewed in detail with the patient. Please refer to After Visit Summary for other counseling recommendations.  28 week labs today  Return in about 2 weeks (around 08/05/2020) for rob.  Tresea Mall, CNM 07/22/2020 2:56 PM

## 2020-07-22 NOTE — Patient Instructions (Signed)

## 2020-07-23 ENCOUNTER — Other Ambulatory Visit: Payer: Self-pay | Admitting: Advanced Practice Midwife

## 2020-07-23 DIAGNOSIS — Z369 Encounter for antenatal screening, unspecified: Secondary | ICD-10-CM

## 2020-07-23 DIAGNOSIS — Z3402 Encounter for supervision of normal first pregnancy, second trimester: Secondary | ICD-10-CM

## 2020-07-23 DIAGNOSIS — R7309 Other abnormal glucose: Secondary | ICD-10-CM

## 2020-07-23 LAB — 28 WEEK RH+PANEL
Basophils Absolute: 0 10*3/uL (ref 0.0–0.2)
Basos: 0 %
EOS (ABSOLUTE): 0.2 10*3/uL (ref 0.0–0.4)
Eos: 2 %
Gestational Diabetes Screen: 166 mg/dL — ABNORMAL HIGH (ref 65–139)
HIV Screen 4th Generation wRfx: NONREACTIVE
Hematocrit: 31.4 % — ABNORMAL LOW (ref 34.0–46.6)
Hemoglobin: 10.7 g/dL — ABNORMAL LOW (ref 11.1–15.9)
Immature Grans (Abs): 0.1 10*3/uL (ref 0.0–0.1)
Immature Granulocytes: 1 %
Lymphocytes Absolute: 1.5 10*3/uL (ref 0.7–3.1)
Lymphs: 15 %
MCH: 33 pg (ref 26.6–33.0)
MCHC: 34.1 g/dL (ref 31.5–35.7)
MCV: 97 fL (ref 79–97)
Monocytes Absolute: 0.9 10*3/uL (ref 0.1–0.9)
Monocytes: 9 %
Neutrophils Absolute: 7.4 10*3/uL — ABNORMAL HIGH (ref 1.4–7.0)
Neutrophils: 73 %
Platelets: 199 10*3/uL (ref 150–450)
RBC: 3.24 x10E6/uL — ABNORMAL LOW (ref 3.77–5.28)
RDW: 12.7 % (ref 11.7–15.4)
RPR Ser Ql: NONREACTIVE
WBC: 10.2 10*3/uL (ref 3.4–10.8)

## 2020-07-29 ENCOUNTER — Other Ambulatory Visit: Payer: Self-pay

## 2020-07-29 ENCOUNTER — Other Ambulatory Visit: Payer: Medicaid Other

## 2020-07-29 DIAGNOSIS — Z369 Encounter for antenatal screening, unspecified: Secondary | ICD-10-CM | POA: Diagnosis not present

## 2020-07-29 DIAGNOSIS — Z3402 Encounter for supervision of normal first pregnancy, second trimester: Secondary | ICD-10-CM | POA: Diagnosis not present

## 2020-07-29 DIAGNOSIS — R7309 Other abnormal glucose: Secondary | ICD-10-CM

## 2020-07-30 LAB — GESTATIONAL GLUCOSE TOLERANCE
Glucose, Fasting: 82 mg/dL (ref 65–94)
Glucose, GTT - 1 Hour: 133 mg/dL (ref 65–179)
Glucose, GTT - 2 Hour: 129 mg/dL (ref 65–154)
Glucose, GTT - 3 Hour: 101 mg/dL (ref 65–139)

## 2020-08-05 ENCOUNTER — Other Ambulatory Visit: Payer: Self-pay

## 2020-08-05 ENCOUNTER — Ambulatory Visit (INDEPENDENT_AMBULATORY_CARE_PROVIDER_SITE_OTHER): Payer: Medicaid Other | Admitting: Obstetrics

## 2020-08-05 VITALS — BP 100/60 | Wt 150.0 lb

## 2020-08-05 DIAGNOSIS — Z23 Encounter for immunization: Secondary | ICD-10-CM | POA: Diagnosis not present

## 2020-08-05 DIAGNOSIS — Z3A30 30 weeks gestation of pregnancy: Secondary | ICD-10-CM

## 2020-08-05 DIAGNOSIS — Z3401 Encounter for supervision of normal first pregnancy, first trimester: Secondary | ICD-10-CM

## 2020-08-05 LAB — POCT URINALYSIS DIPSTICK OB
Glucose, UA: NEGATIVE
POC,PROTEIN,UA: NEGATIVE

## 2020-08-05 NOTE — Progress Notes (Signed)
  Routine Prenatal Care Visit  Subjective  Patricia Nixon is a 30 y.o. G1P0 at [redacted]w[redacted]d being seen today for ongoing prenatal care.  She is currently monitored for the following issues for this low-risk pregnancy and has Encounter for supervision of normal first pregnancy in first trimester on their problem list.  ----------------------------------------------------------------------------------- Patient reports no complaints.   Contractions: Not present. Vag. Bleeding: None.  Movement: Present. Leaking Fluid denies.  ----------------------------------------------------------------------------------- The following portions of the patient's history were reviewed and updated as appropriate: allergies, current medications, past family history, past medical history, past social history, past surgical history and problem list. Problem list updated.  Objective  Blood pressure 100/60, weight 150 lb (68 kg), last menstrual period 01/08/2020. Pregravid weight 112 lb (50.8 kg) Total Weight Gain 38 lb (17.2 kg) Urinalysis: Urine Protein    Urine Glucose    Fetal Status:     Movement: Present     General:  Alert, oriented and cooperative. Patient is in no acute distress.  Skin: Skin is warm and dry. No rash noted.   Cardiovascular: Normal heart rate noted  Respiratory: Normal respiratory effort, no problems with respiration noted  Abdomen: Soft, gravid, appropriate for gestational age. Pain/Pressure: Absent     Pelvic:  Cervical exam deferred        Extremities: Normal range of motion.     Mental Status: Normal mood and affect. Normal behavior. Normal judgment and thought content.   Assessment   30 y.o. G1P0 at [redacted]w[redacted]d by  10/14/2020, by Last Menstrual Period presenting for routine prenatal visit  Plan   pregnancy 1 Problems (from 02/23/20 to present)    Problem Noted Resolved   Encounter for supervision of normal first pregnancy in first trimester 02/23/2020 by Zipporah Plants, CNM No   Overview  Addendum 08/05/2020  2:18 PM by Mirna Mires, CNM     Nursing Staff Provider  Office Location  [facility] Dating   LMP  Language  English Anatomy US  scheduled  Flu Vaccine   decline Genetic Screen  NIPS: decline   TDaP vaccine   08/05/2020 Hgb A1C or  GTT Third trimester : 166, passed the 3hr GTT  Rhogam   n/a   LAB RESULTS   Feeding Plan  breast Blood Type   A +  Contraception  Antibody   neg  Circumcision  Rubella   immune  Pediatrician   RPR   non-reactive  Support Person  HBsAg   negative  Prenatal Classes  HIV   non-reactive    Varicella  immune  BTL Consent no GBS  (For PCN allergy, check sensitivities)        VBAC Consent n/a Pap  collected 02/23/20    Hgb Electro   n/a  Covid Vaccine Counseled -declined                           Preterm labor symptoms and general obstetric precautions including but not limited to vaginal bleeding, contractions, leaking of fluid and fetal movement were reviewed in detail with the patient. Please refer to After Visit Summary for other counseling recommendations.  She passed the 3 hr GTT. tdap today  Return in about 2 weeks (around 08/19/2020) for return OB.  Mirna Mires, CNM  08/05/2020 2:19 PM

## 2020-08-05 NOTE — Progress Notes (Signed)
Tdap/Blood transfusion consent today.

## 2020-08-18 ENCOUNTER — Ambulatory Visit (INDEPENDENT_AMBULATORY_CARE_PROVIDER_SITE_OTHER): Payer: Medicaid Other | Admitting: Obstetrics and Gynecology

## 2020-08-18 ENCOUNTER — Other Ambulatory Visit: Payer: Self-pay

## 2020-08-18 ENCOUNTER — Encounter: Payer: Self-pay | Admitting: Obstetrics and Gynecology

## 2020-08-18 VITALS — BP 114/70 | Ht 62.0 in | Wt 150.4 lb

## 2020-08-18 DIAGNOSIS — Z3A31 31 weeks gestation of pregnancy: Secondary | ICD-10-CM

## 2020-08-18 DIAGNOSIS — Z3403 Encounter for supervision of normal first pregnancy, third trimester: Secondary | ICD-10-CM

## 2020-08-18 NOTE — Progress Notes (Signed)
    Routine Prenatal Care Visit  Subjective  Patricia Nixon is a 30 y.o. G1P0 at [redacted]w[redacted]d being seen today for ongoing prenatal care.  She is currently monitored for the following issues for this low-risk pregnancy and has Encounter for supervision of normal first pregnancy in third trimester on their problem list.  ----------------------------------------------------------------------------------- Patient reports no complaints.   Contractions: Not present. Vag. Bleeding: None.  Movement: Present. Denies leaking of fluid.  ----------------------------------------------------------------------------------- The following portions of the patient's history were reviewed and updated as appropriate: allergies, current medications, past family history, past medical history, past social history, past surgical history and problem list. Problem list updated.   Objective  Blood pressure 114/70, height 5\' 2"  (1.575 m), weight 150 lb 6.4 oz (68.2 kg), last menstrual period 01/08/2020. Pregravid weight 112 lb (50.8 kg) Total Weight Gain 38 lb 6.4 oz (17.4 kg) Urinalysis:      Fetal Status: Fetal Heart Rate (bpm): 130 Fundal Height: 31 cm Movement: Present     General:  Alert, oriented and cooperative. Patient is in no acute distress.  Skin: Skin is warm and dry. No rash noted.   Cardiovascular: Normal heart rate noted  Respiratory: Normal respiratory effort, no problems with respiration noted  Abdomen: Soft, gravid, appropriate for gestational age. Pain/Pressure: Absent     Pelvic:  Cervical exam deferred        Extremities: Normal range of motion.  Edema: None  Mental Status: Normal mood and affect. Normal behavior. Normal judgment and thought content.     Assessment   30 y.o. G1P0 at [redacted]w[redacted]d by  10/14/2020, by Last Menstrual Period presenting for routine prenatal visit  Plan   pregnancy 1 Problems (from 02/23/20 to present)     Problem Noted Resolved   Encounter for supervision of normal first  pregnancy in third trimester 02/23/2020 by 02/25/2020, CNM No   Overview Addendum 08/18/2020 11:18 AM by 08/20/2020, MD     Nursing Staff Provider  Office Location  [facility] Dating   LMP  Language  English Anatomy Natale Milch  scheduled  Flu Vaccine   decline Genetic Screen  NIPS: decline   TDaP vaccine   08/05/2020 Hgb A1C or  GTT Third trimester : 166, passed the 3hr GTT  Rhogam   n/a   LAB RESULTS   Feeding Plan  breast Blood Type   A +  Contraception  Antibody   neg  Circumcision  Rubella   immune  Pediatrician   RPR   non-reactive  Support Person  Husband HBsAg   negative  Prenatal Classes  Discussed HIV   non-reactive    Varicella  immune  BTL Consent no GBS  (For PCN allergy, check sensitivities)        VBAC Consent n/a Pap  collected 02/23/20    Hgb Electro   n/a  Covid Vaccine Counseled -declined                          Discussed prenatal classes Discussed birth control options  Gestational age appropriate obstetric precautions including but not limited to vaginal bleeding, contractions, leaking of fluid and fetal movement were reviewed in detail with the patient.    Return in about 2 weeks (around 09/01/2020) for ROB visit in 2, 4, and 6 weeks.  11/01/2020 MD [facility], [facility] 08/18/2020, 11:19 AM

## 2020-08-18 NOTE — Patient Instructions (Signed)
[email or web address]">  Third Trimester of Pregnancy  The third trimester of pregnancy is from week 28 through week 40. This is months 7 through 9. The third trimester is a time when the unborn baby (fetus) is growing rapidly. At the end of the ninth month, the fetus is about 20inches long and weighs 6-10 pounds. Body changes during your third trimester During the third trimester, your body will continue to go through many changes.The changes vary and generally return to normal after your baby is born. Physical changes Your weight will continue to increase. You can expect to gain 25-35 pounds (11-16 kg) by the end of the pregnancy if you begin pregnancy at a normal weight. If you are underweight, you can expect to gain 28-40 lb (about 13-18 kg), and if you are overweight, you can expect to gain 15-25 lb (about 7-11 kg). You may begin to get stretch marks on your hips, abdomen, and breasts. Your breasts will continue to grow and may hurt. A yellow fluid (colostrum) may leak from your breasts. This is the first milk you are producing for your baby. You may have changes in your hair. These can include thickening of your hair, rapid growth, and changes in texture. Some people also have hair loss during or after pregnancy, or hair that feels dry or thin. Your belly button may stick out. You may notice more swelling in your hands, face, or ankles. Health changes You may have heartburn. You may have constipation. You may develop hemorrhoids. You may develop swollen, bulging veins (varicose veins) in your legs. You may have increased body aches in the pelvis, back, or thighs. This is due to weight gain and increased hormones that are relaxing your joints. You may have increased tingling or numbness in your hands, arms, and legs. The skin on your abdomen may also feel numb. You may feel short of breath because of your expanding uterus. Other  changes You may urinate more often because the fetus is moving lower into your pelvis and pressing on your bladder. You may have more problems sleeping. This may be caused by the size of your abdomen, an increased need to urinate, and an increase in your body's metabolism. You may notice the fetus "dropping," or moving lower in your abdomen (lightening). You may have increased vaginal discharge. You may notice that you have pain around your pelvic bone as your uterus distends. Follow these instructions at home: Medicines Follow your health care provider's instructions regarding medicine use. Specific medicines may be either safe or unsafe to take during pregnancy. Do not take any medicines unless approved by your health care provider. Take a prenatal vitamin that contains at least 600 micrograms (mcg) of folic acid. Eating and drinking Eat a healthy diet that includes fresh fruits and vegetables, whole grains, good sources of protein such as meat, eggs, or tofu, and low-fat dairy products. Avoid raw meat and unpasteurized juice, milk, and cheese. These carry germs that can harm you and your baby. Eat 4 or 5 small meals rather than 3 large meals a day. You may need to take these actions to prevent or treat constipation: Drink enough fluid to keep your urine pale yellow. Eat foods that are high in fiber, such as beans, whole grains, and fresh fruits and vegetables. Limit foods that are high in fat and processed sugars, such as fried or sweet foods. Activity Exercise only as directed by your health care provider. Most people can continue their usual exercise routine during pregnancy. Try  to exercise for 30 minutes at least 5 days a week. Stop exercising if you experience contractions in the uterus. Stop exercising if you develop pain or cramping in the lower abdomen or lower back. Avoid heavy lifting. Do not exercise if it is very hot or humid or if you are at a high altitude. If you choose to,  you may continue to have sex unless your health care provider tells you not to. Relieving pain and discomfort Take frequent breaks and rest with your legs raised (elevated) if you have leg cramps or low back pain. Take warm sitz baths to soothe any pain or discomfort caused by hemorrhoids. Use hemorrhoid cream if your health care provider approves. Wear a supportive bra to prevent discomfort from breast tenderness. If you develop varicose veins: Wear support hose as told by your health care provider. Elevate your feet for 15 minutes, 3-4 times a day. Limit salt in your diet. Safety Talk to your health care provider before traveling far distances. Do not use hot tubs, steam rooms, or saunas. Wear your seat belt at all times when driving or riding in a car. Talk with your health care provider if someone is verbally or physically abusive to you. Preparing for birth To prepare for the arrival of your baby: Take prenatal classes to understand, practice, and ask questions about labor and delivery. Visit the hospital and tour the maternity area. Purchase a rear-facing car seat and make sure you know how to install it in your car. Prepare the baby's room or sleeping area. Make sure to remove all pillows and stuffed animals from the baby's crib to prevent suffocation. General instructions Avoid cat litter boxes and soil used by cats. These carry germs that can cause birth defects in the baby. If you have a cat, ask someone to clean the litter box for you. Do not douche or use tampons. Do not use scented sanitary pads. Do not use any products that contain nicotine or tobacco, such as cigarettes, e-cigarettes, and chewing tobacco. If you need help quitting, ask your health care provider. Do not use any herbal remedies, illegal drugs, or medicines that were not prescribed to you. Chemicals in these products can harm your baby. Do not drink alcohol. You will have more frequent prenatal exams during the  third trimester. During a routine prenatal visit, your health care provider will do a physical exam, perform tests, and discuss your overall health. Keep all follow-up visits. This is important. Where to find more information American Pregnancy Association: americanpregnancy.[place] and Gynecologists: PoolDevices.com.pt Office on [employer]: KeywordPortfolios.com.br Contact a health care provider if you have: A fever. Mild pelvic cramps, pelvic pressure, or nagging pain in your abdominal area or lower back. Vomiting or diarrhea. Bad-smelling vaginal discharge or foul-smelling urine. Pain when you urinate. A headache that does not go away when you take medicine. Visual changes or see spots in front of your eyes. Get help right away if: Your water breaks. You have regular contractions less than 5 minutes apart. You have spotting or bleeding from your vagina. You have severe abdominal pain. You have difficulty breathing. You have chest pain. You have fainting spells. You have not felt your baby move for the time period told by your health care provider. You have new or increased pain, swelling, or redness in an arm or leg. Summary The third trimester of pregnancy is from week 28 through week 40 (months 7 through 9). You may have more problems  sleeping. This can be caused by the size of your abdomen, an increased need to urinate, and an increase in your body's metabolism. You will have more frequent prenatal exams during the third trimester. Keep all follow-up visits. This is important. This information is not intended to replace advice given to you by your health care provider. Make sure you discuss any questions you have with your healthcare provider. Document Revised: 06/18/2019 Document Reviewed: 04/24/2019 Elsevier Patient Education  2022 [employer] Vaginal Delivery  Vaginal delivery means that you give birth by pushing your  baby out of your birth canal (vagina). Your health care team will help you before, during, and after vaginaldelivery. Birth experiences are unique for every woman and every pregnancy, and birthexperiences vary depending on where you choose to give birth. What are the risks and benefits? Generally, this is safe. However, problems may occur, including: Bleeding. Infection. Damage to other structures such as vaginal tearing. Allergic reactions to medicines. Despite the risks, benefits of vaginal delivery include less risk of bleeding and infection and a shorter recovery time compared to a Cesarean delivery.Cesarean delivery, or C-section, is the surgical delivery of a baby. What happens when I arrive at the birth center or hospital? Once you are in labor and have been admitted into the hospital or birth center, your health care team may: Review your pregnancy history and any concerns that you have. Talk with you about your birth plan and discuss pain control options. Check your blood pressure, breathing, and heartbeat. Assess your baby's heartbeat. Monitor your uterus for contractions. Check whether your bag of water (amniotic sac) has broken (ruptured). Insert an IV into one of your veins. This may be used to give you fluids and medicines. Monitoring Your health care team may assess your contractions (uterine monitoring) and your baby's heart rate (fetal monitoring). You may need to be monitored: Often, but not continuously (intermittently). All the time or for long periods at a time (continuously). Continuous monitoring may be needed if: You are taking certain medicines, such as medicine to relieve pain or make your contractions stronger. You have pregnancy or labor complications. Monitoring may be done by: Placing a special stethoscope or a handheld monitoring device on your abdomen to check your baby's heartbeat and to check for contractions. Placing monitors on your abdomen (external  monitors) to record your baby's heartbeat and the frequency and length of contractions. Placing monitors inside your uterus through your vagina (internal monitors) to record your baby's heartbeat and the frequency, length, and strength of your contractions. Depending on the type of monitor, it may remain in your uterus or on your baby's head until birth. Telemetry. This is a type of continuous monitoring that can be done with external or internal monitors. Instead of having to stay in bed, you are able to move around. Physical exam Your health care team may perform frequent physical exams. This may include: Checking how and where your baby is positioned in your uterus. Checking your cervix to determine: Whether it is thinning out (effacing). Whether it is opening up (dilating). What happens during labor and delivery?  Normal labor and delivery is divided into the following three stages: Stage 1 This is the longest stage of labor. Throughout this stage, you will feel contractions. Contractions generally feel mild, infrequent, and irregular at first. They get stronger, more frequent, and more regular as you move through this stage. You may have contractions about every 2-3 minutes. This stage ends when your cervix is completely  dilated to 4 inches (10 cm) and completely effaced. Stage 2 This stage starts once your cervix is completely effaced and dilated and lasts until the delivery of your baby. This is the stage where you will feel an urge to push your baby out of your vagina. You may feel stretching and burning pain, especially when the widest part of your baby's head passes through the vaginal opening (crowning). Once your baby is delivered, the umbilical cord will be clamped and cut. Timing of cutting the cord will depend on your wishes, your baby's health, and your health care provider's practices. Your baby will be placed on your bare chest (skin-to-skin contact) in an upright position and  covered with a warm blanket. If you are choosing to breastfeed, watch your baby for feeding cues, like rooting or sucking, and help the baby to your breast for his or her first feeding. Stage 3 This stage starts immediately after the birth of your baby and ends after you deliver the placenta. This stage may take anywhere from 5 to 30 minutes. After your baby has been delivered, you will feel contractions as your body expels the placenta. These contractions also help your uterus get smaller and reduce bleeding. What can I expect after labor and delivery? After labor is over, you and your baby will be assessed closely until you are ready to go home. Your health care team will teach you how to care for yourself and your baby. You and your baby may be encouraged to stay in the same room (rooming in) during your hospital stay. This will help promote early bonding and successful breastfeeding. Your uterus will be checked and massaged regularly (fundal massage). You may continue to receive fluids and medicines through an IV. You will have some soreness and pain in your abdomen, vagina, and the area of skin between your vaginal opening and your anus (perineum). If an incision was made near your vagina (episiotomy) or if you had some vaginal tearing during delivery, cold compresses may be placed on your episiotomy or your tear. This helps to reduce pain and swelling. It is normal to have vaginal bleeding after delivery. Wear a sanitary pad for vaginal bleeding and discharge. Summary Vaginal delivery means that you will give birth by pushing your baby out of your birth canal (vagina). Your health care team will monitor you and your baby throughout the stages of labor. After you deliver your baby, your health care team will continue to assess you and your baby to ensure you are both recovering as expected after delivery. This information is not intended to replace advice given to you by your health care  provider. Make sure you discuss any questions you have with your healthcare provider. Document Revised: 12/08/2019 Document Reviewed: 12/08/2019 Elsevier Patient Education  2022 Else[employer] Inc. Pain Relief During Labor and Delivery Many things can cause pain during labor and delivery, including: Pressure due to the baby moving through the pelvis. Stretching of tissues due to the baby moving through the birth canal. Muscle tension due to anxiety or nervousness. The uterus tightening (contracting)and relaxing to help move the baby. How do I get pain relief during labor and delivery?  Discuss your pain relief options with your health care provider during your prenatal visits. Explore the options offered by your hospital or birth center. There are many ways to deal with the pain of labor and delivery. You can try relaxation techniques or doing relaxing activities, taking a warm shower or bath (hydrotherapy), or  other methods. There are also many medicines available to help controlpain. Relaxation techniques and activities Practice relaxation techniques or do relaxing activities, such as: Focused breathing. Meditation. Visualization. Aroma therapy. Listening to your favorite music. Hypnosis. Hydrotherapy Take a warm shower or bath. This may: Provide comfort and relaxation. Lessen your feeling of pain. Reduce the amount of pain medicine needed. Shorten the length of labor. Other methods Try doing other things, such as: Getting a massage or having counterpressure on your back. Applying warm packs or ice packs. Changing positions often, moving around, or using a birthing ball. Medicines You may be given: Pain medicine through an IV or an injection into a muscle. Pain medicine inserted into your spinal column. Injections of sterile water just under the skin on your lower back. Nitrous oxide inhalation therapy, also called laughing gas. What kinds of medicine are available for pain  relief? There are two kinds of medicines that can be used to relieve pain during labor and delivery: Analgesics. These medicines decrease pain without causing you to lose feeling or the ability to move your muscles. Anesthetics. These medicines block feeling in the body and can decrease your ability to move freely. Both kinds of medicine can cause minor side effects, such as nausea, trouble concentrating, and sleepiness. They can also affect the baby's heart rate before birth and his or her breathing after birth. For this reason, health careproviders are careful about when and how much medicine is given. Which medicines are used to provide pain relief? Common medicines The most common medicines used to help manage pain during labor and delivery include: Opioids. Opioids are medicines that decrease how much pain you feel (perception of pain). These medicines can be given through an IV or may be used with anesthetics to block pain. Epidural analgesia. Epidural analgesia is given through a very thin tube that is inserted into the lower back. Medicine is delivered continuously to the area near your spinal column nerves (epidural space). After having this treatment, you may be able to move your legs, but you will not be able to walk. Depending on the amount and type of medicine given, you may lose all feeling in the lower half of your body, or you may have some sensation, including the urge to push. This treatment can be used to give pain relief for a vaginal birth. Sometimes, a numbing medicine is injected into the spinal fluid when an epidural catheter is placed. This provides for immediate relief but only lasts for 1-2 hours. Once it wears off, the epidural will provide pain relief. This is called a combined spinal-epidural (CSE) block. Intrathecal analgesia (spinal analgesia). Intrathecal analgesia is similar to epidural analgesia, but the medicine is injected into the spinal fluid instead of the epidural  space. It is usually only given once. It starts to relieve pain quickly, but the pain relief lasts only 1-2 hours. Pudendal block. This block is done by injecting numbing medicine through the wall of the vagina and into a nerve in the pelvis. Other medicines Other medicines used to help manage pain during labor and delivery include: Local anesthetics. These are used to numb a small area of the body. They may be used along with another kind of medicine or used to numb the nerves of the vagina, cervix, and perineum during the second stage of labor. Spinal block (spinal anesthesia). Spinal anesthesia is similar to spinal analgesia, but the medicine that is used contains longer-acting numbing medicines and pain medicines. This type of anesthesia  can be used for a cesarean delivery and allows you to stay awake for the birth of your baby. General anesthetics cause you to lose consciousness so you do not feel pain. They are usually only used for an emergency cesarean delivery. These medicines are given through an IV or a mask or both. These medicines are used as part of a procedure or for an emergency delivery. Summary Women have many options to help them manage the pain associated with labor and delivery. You can try doing relaxing activities, taking a warm shower or bath, or other methods. There are also many medicines available to help control pain during labor and delivery. Talk with your health care provider about what options are available to you. This information is not intended to replace advice given to you by your health care provider. Make sure you discuss any questions you have with your healthcare provider. Document Revised: 11/27/2018 Document Reviewed: 11/27/2018 Elsevier Patient Education  2022 [employer].

## 2020-09-03 ENCOUNTER — Ambulatory Visit (INDEPENDENT_AMBULATORY_CARE_PROVIDER_SITE_OTHER): Payer: Medicaid Other | Admitting: Advanced Practice Midwife

## 2020-09-03 ENCOUNTER — Encounter: Payer: Self-pay | Admitting: Advanced Practice Midwife

## 2020-09-03 ENCOUNTER — Other Ambulatory Visit: Payer: Self-pay

## 2020-09-03 VITALS — BP 90/60 | Wt 153.0 lb

## 2020-09-03 DIAGNOSIS — Z3403 Encounter for supervision of normal first pregnancy, third trimester: Secondary | ICD-10-CM

## 2020-09-03 DIAGNOSIS — Z3A34 34 weeks gestation of pregnancy: Secondary | ICD-10-CM

## 2020-09-03 NOTE — Progress Notes (Signed)
ROB- no concerns 

## 2020-09-03 NOTE — Progress Notes (Signed)
  Routine Prenatal Care Visit  Subjective  Patricia Nixon is a 30 y.o. G1P0 at [redacted]w[redacted]d being seen today for ongoing prenatal care.  She is currently monitored for the following issues for this low-risk pregnancy and has Encounter for supervision of normal first pregnancy in third trimester on their problem list.  ----------------------------------------------------------------------------------- Patient reports no complaints.   Contractions: Not present. Vag. Bleeding: None.  Movement: Present. Leaking Fluid denies.  ----------------------------------------------------------------------------------- The following portions of the patient's history were reviewed and updated as appropriate: allergies, current medications, past family history, past medical history, past social history, past surgical history and problem list. Problem list updated.  Objective  Blood pressure 90/60, weight 153 lb (69.4 kg), last menstrual period 01/08/2020. Pregravid weight 112 lb (50.8 kg) Total Weight Gain 41 lb (18.6 kg) Urinalysis: Urine Protein    Urine Glucose    Fetal Status: Fetal Heart Rate (bpm): 141 Fundal Height: 34 cm Movement: Present     General:  Alert, oriented and cooperative. Patient is in no acute distress.  Skin: Skin is warm and dry. No rash noted.   Cardiovascular: Normal heart rate noted  Respiratory: Normal respiratory effort, no problems with respiration noted  Abdomen: Soft, gravid, appropriate for gestational age. Pain/Pressure: Absent     Pelvic:  Cervical exam deferred        Extremities: Normal range of motion.  Edema: None  Mental Status: Normal mood and affect. Normal behavior. Normal judgment and thought content.   Assessment   30 y.o. G1P0 at [redacted]w[redacted]d by  10/14/2020, by Last Menstrual Period presenting for routine prenatal visit  Plan   pregnancy 1 Problems (from 02/23/20 to present)    Problem Noted Resolved   Encounter for supervision of normal first pregnancy in third  trimester 02/23/2020 by Zipporah Plants, CNM No   Overview Addendum 09/03/2020  4:16 PM by Tresea Mall, CNM     Nursing Staff Provider  Office Location  [facility] Dating   LMP  Language  English Anatomy US  scheduled  Flu Vaccine   decline Genetic Screen  NIPS: decline   TDaP vaccine   08/05/2020 Hgb A1C or  GTT Third trimester : 166, passed the 3hr GTT  Rhogam   n/a   LAB RESULTS   Feeding Plan  breast Blood Type   A +  Contraception POP Antibody   neg  Circumcision  Rubella   immune  Pediatrician   RPR   non-reactive  Support Person  Husband HBsAg   negative  Prenatal Classes  Discussed HIV   non-reactive    Varicella  immune  BTL Consent no GBS  (For PCN allergy, check sensitivities)        VBAC Consent n/a Pap  collected 02/23/20    Hgb Electro   n/a  Covid Vaccine Counseled -declined                           Preterm labor symptoms and general obstetric precautions including but not limited to vaginal bleeding, contractions, leaking of fluid and fetal movement were reviewed in detail with the patient.   Return in about 2 weeks (around 09/17/2020) for rob.  Tresea Mall, CNM 09/03/2020 4:18 PM

## 2020-09-15 ENCOUNTER — Ambulatory Visit (INDEPENDENT_AMBULATORY_CARE_PROVIDER_SITE_OTHER): Payer: Medicaid Other | Admitting: Obstetrics

## 2020-09-15 ENCOUNTER — Other Ambulatory Visit: Payer: Self-pay

## 2020-09-15 VITALS — BP 118/74 | Wt 157.0 lb

## 2020-09-15 DIAGNOSIS — Z3A35 35 weeks gestation of pregnancy: Secondary | ICD-10-CM | POA: Diagnosis not present

## 2020-09-15 DIAGNOSIS — Z3403 Encounter for supervision of normal first pregnancy, third trimester: Secondary | ICD-10-CM

## 2020-09-15 NOTE — Progress Notes (Signed)
No vb. No lof. GBS today.  

## 2020-09-15 NOTE — Progress Notes (Signed)
  Routine Prenatal Care Visit  Subjective  Patricia Nixon is a 30 y.o. G1P0 at [redacted]w[redacted]d being seen today for ongoing prenatal care.  She is currently monitored for the following issues for this low-risk pregnancy and has Encounter for supervision of normal first pregnancy in third trimester on their problem list.  ----------------------------------------------------------------------------------- Patient reports no complaints.    .  .   [name] denies.  ----------------------------------------------------------------------------------- The following portions of the patient's history were reviewed and updated as appropriate: allergies, current medications, past family history, past medical history, past social history, past surgical history and problem list. Problem list updated.  Objective  Blood pressure 118/74, weight 157 lb (71.2 kg), last menstrual period 01/08/2020. Pregravid weight 112 lb (50.8 kg) Total Weight Gain 45 lb (20.4 kg) Urinalysis: Urine Protein    Urine Glucose    Fetal Status:           General:  Alert, oriented and cooperative. Patient is in no acute distress.  Skin: Skin is warm and dry. No rash noted.   Cardiovascular: Normal heart rate noted  Respiratory: Normal respiratory effort, no problems with respiration noted  Abdomen: Soft, gravid, appropriate for gestational age.       Pelvic: 1cm/thick/ballotable  Extremities: Normal range of motion.     Mental Status: Normal mood and affect. Normal behavior. Normal judgment and thought content.   Assessment   30 y.o. G1P0 at [redacted]w[redacted]d by  10/14/2020, by Last Menstrual Period presenting for routine prenatal visit  Plan   pregnancy 1 Problems (from 02/23/20 to present)    Problem Noted Resolved   Encounter for supervision of normal first pregnancy in third trimester 02/23/2020 by Zipporah Plants, CNM No   Overview Addendum 09/15/2020  4:18 PM by Mirna Mires, CNM     Nursing Staff Provider  Office Location   [facility] Dating   LMP  Language  English Anatomy US  scheduled  Flu Vaccine   decline Genetic Screen  NIPS: decline   TDaP vaccine   08/05/2020 Hgb A1C or  GTT Third trimester : 166, passed the 3hr GTT  Rhogam   n/a   LAB RESULTS   Feeding Plan  breast Blood Type   A +  Contraception POP Antibody   neg  Circumcision  Rubella   immune  Pediatrician   RPR   non-reactive  Support Person  Husband HBsAg   negative  Prenatal Classes  Discussed HIV   non-reactive    Varicella  immune  BTL Consent no GBS  (For PCN allergy, check sensitivities) 09/15/2020       VBAC Consent n/a Pap  collected 02/23/20    Hgb Electro   n/a  Covid Vaccine Counseled -declined                           Term labor symptoms and general obstetric precautions including but not limited to vaginal bleeding, contractions, leaking of fluid and fetal movement were reviewed in detail with the patient. Please refer to After Visit Summary for other counseling recommendations.  GBS screening today.  Return in about 1 week (around 09/22/2020) for return OB.  Mirna Mires, CNM  09/15/2020 4:19 PM

## 2020-09-18 LAB — CULTURE, BETA STREP (GROUP B ONLY): Strep Gp B Culture: POSITIVE — AB

## 2020-09-29 ENCOUNTER — Other Ambulatory Visit: Payer: Self-pay

## 2020-09-29 ENCOUNTER — Encounter: Payer: Self-pay | Admitting: Advanced Practice Midwife

## 2020-09-29 ENCOUNTER — Ambulatory Visit (INDEPENDENT_AMBULATORY_CARE_PROVIDER_SITE_OTHER): Payer: Medicaid Other | Admitting: Advanced Practice Midwife

## 2020-09-29 VITALS — BP 100/60 | Wt 159.0 lb

## 2020-09-29 DIAGNOSIS — Z3403 Encounter for supervision of normal first pregnancy, third trimester: Secondary | ICD-10-CM

## 2020-09-29 DIAGNOSIS — Z3A37 37 weeks gestation of pregnancy: Secondary | ICD-10-CM

## 2020-09-29 LAB — POCT URINALYSIS DIPSTICK OB
Glucose, UA: NEGATIVE
POC,PROTEIN,UA: NEGATIVE

## 2020-09-29 NOTE — Progress Notes (Signed)
  Routine Prenatal Care Visit  Subjective  Patricia Nixon is a 30 y.o. G1P0 at [redacted]w[redacted]d being seen today for ongoing prenatal care.  She is currently monitored for the following issues for this low-risk pregnancy and has Encounter for supervision of normal first pregnancy in third trimester on their problem list.  ----------------------------------------------------------------------------------- Patient reports no complaints.   Contractions: Not present. Vag. Bleeding: None.  Movement: Present. Leaking Fluid denies.  ----------------------------------------------------------------------------------- The following portions of the patient's history were reviewed and updated as appropriate: allergies, current medications, past family history, past medical history, past social history, past surgical history and problem list. Problem list updated.  Objective  Blood pressure 100/60, weight 159 lb (72.1 kg), last menstrual period 01/08/2020. Pregravid weight 112 lb (50.8 kg) Total Weight Gain 47 lb (21.3 kg) Urinalysis: Urine Protein    Urine Glucose    Fetal Status: Fetal Heart Rate (bpm): 152 Fundal Height: 38 cm Movement: Present     General:  Alert, oriented and cooperative. Patient is in no acute distress.  Skin: Skin is warm and dry. No rash noted.   Cardiovascular: Normal heart rate noted  Respiratory: Normal respiratory effort, no problems with respiration noted  Abdomen: Soft, gravid, appropriate for gestational age. Pain/Pressure: Present     Pelvic:  Cervical exam deferred        Extremities: Normal range of motion.  Edema: None  Mental Status: Normal mood and affect. Normal behavior. Normal judgment and thought content.   Assessment   30 y.o. G1P0 at [redacted]w[redacted]d by  10/14/2020, by Last Menstrual Period presenting for routine prenatal visit  Plan   pregnancy 1 Problems (from 02/23/20 to present)    Problem Noted Resolved   Encounter for supervision of normal first pregnancy in third  trimester 02/23/2020 by Zipporah Plants, CNM No   Overview Addendum 09/18/2020  9:40 AM by Mirna Mires, CNM     Nursing Staff Provider  Office Location  [facility] Dating   LMP  Language  English Anatomy US  scheduled  Flu Vaccine   decline Genetic Screen  NIPS: decline   TDaP vaccine   08/05/2020 Hgb A1C or  GTT Third trimester : 166, passed the 3hr GTT  Rhogam   n/a   LAB RESULTS   Feeding Plan  breast Blood Type   A +  Contraception POP Antibody   neg  Circumcision  Rubella   immune  Pediatrician   RPR   non-reactive  Support Person  Husband HBsAg   negative  Prenatal Classes  Discussed HIV   non-reactive    Varicella  immune  BTL Consent no GBS  (For PCN allergy, check sensitivities) positive       VBAC Consent n/a Pap  collected 02/23/20    Hgb Electro   n/a  Covid Vaccine Counseled -declined                           Term labor symptoms and general obstetric precautions including but not limited to vaginal bleeding, contractions, leaking of fluid and fetal movement were reviewed in detail with the patient. Please refer to After Visit Summary for other counseling recommendations.   Return in about 1 week (around 10/06/2020) for rob weekly x2.  Tresea Mall, CNM 09/29/2020 4:13 PM

## 2020-09-29 NOTE — Patient Instructions (Signed)
Vaginal Delivery Vaginal delivery means that you give birth by pushing your baby out of your birth canal (vagina). Your health care team will help you before, during, and after vaginal delivery. Birth experiences are unique for every woman and every pregnancy, and birth experiences vary depending on where you choose to give birth. What are the risks and benefits? Generally, this is safe. However, problems may occur, including: Bleeding. Infection. Damage to other structures such as vaginal tearing. Allergic reactions to medicines. Despite the risks, benefits of vaginal delivery include less risk of bleeding and infection and a shorter recovery time compared to a Cesarean delivery. Cesarean delivery, or C-section, is the surgical delivery of a baby. What happens when I arrive at the birth center or hospital? Once you are in labor and have been admitted into the hospital or birth center, your health care team may: Review your pregnancy history and any concerns that you have. Talk with you about your birth plan and discuss pain control options. Check your blood pressure, breathing, and heartbeat. Assess your baby's heartbeat. Monitor your uterus for contractions. Check whether your bag of water (amniotic sac) has broken (ruptured). Insert an IV into one of your veins. This may be used to give you fluids and medicines. Monitoring Your health care team may assess your contractions (uterine monitoring) and your baby's heart rate (fetal monitoring). You may need to be monitored: Often, but not continuously (intermittently). All the time or for long periods at a time (continuously). Continuous monitoring may be needed if: You are taking certain medicines, such as medicine to relieve pain or make your contractions stronger. You have pregnancy or labor complications. Monitoring may be done by: Placing a special stethoscope or a handheld monitoring device on your abdomen to check your baby's heartbeat  and to check for contractions. Placing monitors on your abdomen (external monitors) to record your baby's heartbeat and the frequency and length of contractions. Placing monitors inside your uterus through your vagina (internal monitors) to record your baby's heartbeat and the frequency, length, and strength of your contractions. Depending on the type of monitor, it may remain in your uterus or on your baby's head until birth. Telemetry. This is a type of continuous monitoring that can be done with external or internal monitors. Instead of having to stay in bed, you are able to move around. Physical exam Your health care team may perform frequent physical exams. This may include: Checking how and where your baby is positioned in your uterus. Checking your cervix to determine: Whether it is thinning out (effacing). Whether it is opening up (dilating). What happens during labor and delivery? Normal labor and delivery is divided into the following three stages: Stage 1 This is the longest stage of labor. Throughout this stage, you will feel contractions. Contractions generally feel mild, infrequent, and irregular at first. They get stronger, more frequent, and more regular as you move through this stage. You may have contractions about every 2-3 minutes. This stage ends when your cervix is completely dilated to 4 inches (10 cm) and completely effaced. Stage 2 This stage starts once your cervix is completely effaced and dilated and lasts until the delivery of your baby. This is the stage where you will feel an urge to push your baby out of your vagina. You may feel stretching and burning pain, especially when the widest part of your baby's head passes through the vaginal opening (crowning). Once your baby is delivered, the umbilical cord will be clamped and  cut. Timing of cutting the cord will depend on your wishes, your baby's health, and your health care provider's practices. Your baby will be  placed on your bare chest (skin-to-skin contact) in an upright position and covered with a warm blanket. If you are choosing to breastfeed, watch your baby for feeding cues, like rooting or sucking, and help the baby to your breast for his or her first feeding. Stage 3 This stage starts immediately after the birth of your baby and ends after you deliver the placenta. This stage may take anywhere from 5 to 30 minutes. After your baby has been delivered, you will feel contractions as your body expels the placenta. These contractions also help your uterus get smaller and reduce bleeding. What can I expect after labor and delivery? After labor is over, you and your baby will be assessed closely until you are ready to go home. Your health care team will teach you how to care for yourself and your baby. You and your baby may be encouraged to stay in the same room (rooming in) during your hospital stay. This will help promote early bonding and successful breastfeeding. Your uterus will be checked and massaged regularly (fundal massage). You may continue to receive fluids and medicines through an IV. You will have some soreness and pain in your abdomen, vagina, and the area of skin between your vaginal opening and your anus (perineum). If an incision was made near your vagina (episiotomy) or if you had some vaginal tearing during delivery, cold compresses may be placed on your episiotomy or your tear. This helps to reduce pain and swelling. It is normal to have vaginal bleeding after delivery. Wear a sanitary pad for vaginal bleeding and discharge. Summary Vaginal delivery means that you will give birth by pushing your baby out of your birth canal (vagina). Your health care team will monitor you and your baby throughout the stages of labor. After you deliver your baby, your health care team will continue to assess you and your baby to ensure you are both recovering as expected after delivery. This  information is not intended to replace advice given to you by your health care provider. Make sure you discuss any questions you have with your health care provider. Document Revised: 12/08/2019 Document Reviewed: 12/08/2019 Elsevier Patient Education  2022 [employer]. Pain Relief During Labor and Delivery Many things can cause pain during labor and delivery, including: Pressure due to the baby moving through the pelvis. Stretching of tissues due to the baby moving through the birth canal. Muscle tension due to anxiety or nervousness. The uterus tightening (contracting)and relaxing to help move the baby. How do I get pain relief during labor and delivery? Discuss your pain relief options with your health care provider during your prenatal visits. Explore the options offered by your hospital or birth center. There are many ways to deal with the pain of labor and delivery. You can try relaxation techniques or doing relaxing activities, taking a warm shower or bath (hydrotherapy), or other methods. There are also many medicines available to help control pain. Relaxation techniques and activities Practice relaxation techniques or do relaxing activities, such as: Focused breathing. Meditation. Visualization. Aroma therapy. Listening to your favorite music. Hypnosis. Hydrotherapy Take a warm shower or bath. This may: Provide comfort and relaxation. Lessen your feeling of pain. Reduce the amount of pain medicine needed. Shorten the length of labor. Other methods Try doing other things, such as: Getting a massage or having counterpressure on  your back. Applying warm packs or ice packs. Changing positions often, moving around, or using a birthing ball. Medicines You may be given: Pain medicine through an IV or an injection into a muscle. Pain medicine inserted into your spinal column. Injections of sterile water just under the skin on your lower back. Nitrous oxide inhalation therapy,  also called laughing gas. What kinds of medicine are available for pain relief? There are two kinds of medicines that can be used to relieve pain during labor and delivery: Analgesics. These medicines decrease pain without causing you to lose feeling or the ability to move your muscles. Anesthetics. These medicines block feeling in the body and can decrease your ability to move freely. Both kinds of medicine can cause minor side effects, such as nausea, trouble concentrating, and sleepiness. They can also affect the baby's heart rate before birth and his or her breathing after birth. For this reason, health care providers are careful about when and how much medicine is given. Which medicines are used to provide pain relief? Common medicines The most common medicines used to help manage pain during labor and delivery include: Opioids. Opioids are medicines that decrease how much pain you feel (perception of pain). These medicines can be given through an IV or may be used with anesthetics to block pain. Epidural analgesia. Epidural analgesia is given through a very thin tube that is inserted into the lower back. Medicine is delivered continuously to the area near your spinal column nerves (epidural space). After having this treatment, you may be able to move your legs, but you will not be able to walk. Depending on the amount and type of medicine given, you may lose all feeling in the lower half of your body, or you may have some sensation, including the urge to push. This treatment can be used to give pain relief for a vaginal birth. Sometimes, a numbing medicine is injected into the spinal fluid when an epidural catheter is placed. This provides for immediate relief but only lasts for 1-2 hours. Once it wears off, the epidural will provide pain relief. This is called a combined spinal-epidural (CSE) block. Intrathecal analgesia (spinal analgesia). Intrathecal analgesia is similar to epidural analgesia,  but the medicine is injected into the spinal fluid instead of the epidural space. It is usually only given once. It starts to relieve pain quickly, but the pain relief lasts only 1-2 hours. Pudendal block. This block is done by injecting numbing medicine through the wall of the vagina and into a nerve in the pelvis. Other medicines Other medicines used to help manage pain during labor and delivery include: Local anesthetics. These are used to numb a small area of the body. They may be used along with another kind of medicine or used to numb the nerves of the vagina, cervix, and perineum during the second stage of labor. Spinal block (spinal anesthesia). Spinal anesthesia is similar to spinal analgesia, but the medicine that is used contains longer-acting numbing medicines and pain medicines. This type of anesthesia can be used for a cesarean delivery and allows you to stay awake for the birth of your baby. General anesthetics cause you to lose consciousness so you do not feel pain. They are usually only used for an emergency cesarean delivery. These medicines are given through an IV or a mask or both. These medicines are used as part of a procedure or for an emergency delivery. Summary Women have many options to help them manage the pain associated  delivery. You can try doing relaxing activities, taking a warm shower or bath, or other methods. There are also many medicines available to help control pain during labor and delivery. Talk with your health care provider about what options are available to you. This information is not intended to replace advice given to you by your health care provider. Make sure you discuss any questions you have with your healthcare provider. Document Revised: 11/27/2018 Document Reviewed: 11/27/2018 Elsevier Patient Education  2022 Elsevier Inc.  

## 2020-10-05 ENCOUNTER — Encounter: Payer: Self-pay | Admitting: Advanced Practice Midwife

## 2020-10-05 ENCOUNTER — Ambulatory Visit (INDEPENDENT_AMBULATORY_CARE_PROVIDER_SITE_OTHER): Payer: Medicaid Other | Admitting: Advanced Practice Midwife

## 2020-10-05 ENCOUNTER — Other Ambulatory Visit: Payer: Self-pay

## 2020-10-05 VITALS — BP 120/70 | Wt 158.6 lb

## 2020-10-05 DIAGNOSIS — Z3A38 38 weeks gestation of pregnancy: Secondary | ICD-10-CM

## 2020-10-05 DIAGNOSIS — Z3403 Encounter for supervision of normal first pregnancy, third trimester: Secondary | ICD-10-CM

## 2020-10-05 NOTE — Progress Notes (Signed)
  Routine Prenatal Care Visit  Subjective  Patricia Nixon is a 30 y.o. G1P0 at [redacted]w[redacted]d being seen today for ongoing prenatal care.  She is currently monitored for the following issues for this low-risk pregnancy and has Encounter for supervision of normal first pregnancy in third trimester on their problem list.  ----------------------------------------------------------------------------------- Patient reports no complaints.   Contractions: Irregular. Vag. Bleeding: None.  Movement: Present. Leaking Fluid denies.  ----------------------------------------------------------------------------------- The following portions of the patient's history were reviewed and updated as appropriate: allergies, current medications, past family history, past medical history, past social history, past surgical history and problem list. Problem list updated.  Objective  Blood pressure 120/70, weight 158 lb 9.6 oz (71.9 kg), last menstrual period 01/08/2020. Pregravid weight 112 lb (50.8 kg) Total Weight Gain 46 lb 9.6 oz (21.1 kg) Urinalysis: Urine Protein    Urine Glucose    Fetal Status: Fetal Heart Rate (bpm): 145 Fundal Height: 37 cm Movement: Present  Presentation: Vertex  General:  Alert, oriented and cooperative. Patient is in no acute distress.  Skin: Skin is warm and dry. No rash noted.   Cardiovascular: Normal heart rate noted  Respiratory: Normal respiratory effort, no problems with respiration noted  Abdomen: Soft, gravid, appropriate for gestational age. Pain/Pressure: Absent     Pelvic:  Cervical exam performed Dilation: 3 Effacement (%): 60 Station: -2, cervical sweep  Extremities: Normal range of motion.  Edema: None  Mental Status: Normal mood and affect. Normal behavior. Normal judgment and thought content.   Assessment   30 y.o. G1P0 at [redacted]w[redacted]d by  10/14/2020, by Last Menstrual Period presenting for routine prenatal visit  Plan   pregnancy 1 Problems (from 02/23/20 to present)    Problem  Noted Resolved   Encounter for supervision of normal first pregnancy in third trimester 02/23/2020 by Zipporah Plants, CNM No   Overview Addendum 09/18/2020  9:40 AM by Mirna Mires, CNM     Nursing Staff Provider  Office Location  [facility] Dating   LMP  Language  English Anatomy US  scheduled  Flu Vaccine   decline Genetic Screen  NIPS: decline   TDaP vaccine   08/05/2020 Hgb A1C or  GTT Third trimester : 166, passed the 3hr GTT  Rhogam   n/a   LAB RESULTS   Feeding Plan  breast Blood Type   A +  Contraception POP Antibody   neg  Circumcision  Rubella   immune  Pediatrician   RPR   non-reactive  Support Person  Husband Robert HBsAg   negative  Prenatal Classes  Discussed HIV   non-reactive    Varicella  immune  BTL Consent no GBS  (For PCN allergy, check sensitivities) positive       VBAC Consent n/a Pap  collected 02/23/20    Hgb Electro   n/a  Covid Vaccine Counseled -declined                           Term labor symptoms and general obstetric precautions including but not limited to vaginal bleeding, contractions, leaking of fluid and fetal movement were reviewed in detail with the patient.    Return for scheduled visit.  Tresea Mall, CNM 10/05/2020 2:59 PM

## 2020-10-13 ENCOUNTER — Ambulatory Visit (INDEPENDENT_AMBULATORY_CARE_PROVIDER_SITE_OTHER): Payer: Medicaid Other | Admitting: Obstetrics

## 2020-10-13 ENCOUNTER — Other Ambulatory Visit: Payer: Self-pay

## 2020-10-13 VITALS — BP 118/72 | Wt 159.0 lb

## 2020-10-13 DIAGNOSIS — Z3A39 39 weeks gestation of pregnancy: Secondary | ICD-10-CM

## 2020-10-13 DIAGNOSIS — Z3403 Encounter for supervision of normal first pregnancy, third trimester: Secondary | ICD-10-CM

## 2020-10-13 NOTE — Progress Notes (Signed)
  Routine Prenatal Care Visit  Subjective  Patricia Nixon is a 30 y.o. G1P0 at [redacted]w[redacted]d being seen today for ongoing prenatal care.  She is currently monitored for the following issues for this low-risk pregnancy and has Encounter for supervision of normal first pregnancy in third trimester on their problem list.  ----------------------------------------------------------------------------------- Patient reports occasional contractions and she passed her mucous plug recently. Her baby is moving , but has been quieter today. She thinks she may labor soon.    .  .   [name] denies.  ----------------------------------------------------------------------------------- The following portions of the patient's history were reviewed and updated as appropriate: allergies, current medications, past family history, past medical history, past social history, past surgical history and problem list. Problem list updated.  Objective  Blood pressure 118/72, weight 159 lb (72.1 kg), last menstrual period 01/08/2020. Pregravid weight 112 lb (50.8 kg) Total Weight Gain 47 lb (21.3 kg) Urinalysis: Urine Protein    Urine Glucose    Fetal Status:           General:  Alert, oriented and cooperative. Patient is in no acute distress.  Skin: Skin is warm and dry. No rash noted.   Cardiovascular: Normal heart rate noted  Respiratory: Normal respiratory effort, no problems with respiration noted  Abdomen: Soft, gravid, appropriate for gestational age.       Pelvic:  Cervical exam performed      loose 3cms/60%/-3-2. Cervix is soft and "ripe". Swept.  Extremities: Normal range of motion.     Mental Status: Normal mood and affect. Normal behavior. Normal judgment and thought content.   Assessment   30 y.o. G1P0 at [redacted]w[redacted]d by  10/14/2020, by Last Menstrual Period presenting for routine prenatal visit  Plan   pregnancy 1 Problems (from 02/23/20 to present)    Problem Noted Resolved   Encounter for supervision of  normal first pregnancy in third trimester 02/23/2020 by Zipporah Plants, CNM No   Overview Addendum 09/18/2020  9:40 AM by Mirna Mires, CNM     Nursing Staff Provider  Office Location  [facility] Dating   LMP  Language  English Anatomy US  scheduled  Flu Vaccine   decline Genetic Screen  NIPS: decline   TDaP vaccine   08/05/2020 Hgb A1C or  GTT Third trimester : 166, passed the 3hr GTT  Rhogam   n/a   LAB RESULTS   Feeding Plan  breast Blood Type   A +  Contraception POP Antibody   neg  Circumcision  Rubella   immune  Pediatrician   RPR   non-reactive  Support Person  Husband HBsAg   negative  Prenatal Classes  Discussed HIV   non-reactive    Varicella  immune  BTL Consent no GBS  (For PCN allergy, check sensitivities) positive       VBAC Consent n/a Pap  collected 02/23/20    Hgb Electro   n/a  Covid Vaccine Counseled -declined                           Term labor symptoms and general obstetric precautions including but not limited to vaginal bleeding, contractions, leaking of fluid and fetal movement were reviewed in detail with the patient. Please refer to After Visit Summary for other counseling recommendations.   No follow-ups on file.  Mirna Mires, CNM  10/13/2020 5:09 PM

## 2020-10-13 NOTE — Progress Notes (Signed)
ROB - requesting cervical exam. RM 4 °

## 2020-10-15 ENCOUNTER — Encounter: Payer: Self-pay | Admitting: Obstetrics and Gynecology

## 2020-10-15 ENCOUNTER — Other Ambulatory Visit: Payer: Self-pay

## 2020-10-15 ENCOUNTER — Inpatient Hospital Stay: Payer: Medicaid Other | Admitting: Anesthesiology

## 2020-10-15 ENCOUNTER — Inpatient Hospital Stay
Admission: EM | Admit: 2020-10-15 | Discharge: 2020-10-17 | DRG: 768 | Disposition: A | Discharge status: Home / Self Care | Payer: Medicaid Other | Attending: Obstetrics and Gynecology | Admitting: Obstetrics and Gynecology

## 2020-10-15 DIAGNOSIS — F17290 Nicotine dependence, other tobacco product, uncomplicated: Secondary | ICD-10-CM | POA: Diagnosis present

## 2020-10-15 DIAGNOSIS — D62 Acute posthemorrhagic anemia: Secondary | ICD-10-CM | POA: Diagnosis not present

## 2020-10-15 DIAGNOSIS — O480 Post-term pregnancy: Secondary | ICD-10-CM | POA: Diagnosis not present

## 2020-10-15 DIAGNOSIS — Z3403 Encounter for supervision of normal first pregnancy, third trimester: Principal | ICD-10-CM

## 2020-10-15 DIAGNOSIS — Z3A40 40 weeks gestation of pregnancy: Secondary | ICD-10-CM | POA: Diagnosis not present

## 2020-10-15 DIAGNOSIS — Z23 Encounter for immunization: Secondary | ICD-10-CM | POA: Diagnosis not present

## 2020-10-15 DIAGNOSIS — Z20822 Contact with and (suspected) exposure to covid-19: Secondary | ICD-10-CM | POA: Diagnosis present

## 2020-10-15 DIAGNOSIS — O99334 Smoking (tobacco) complicating childbirth: Secondary | ICD-10-CM | POA: Diagnosis present

## 2020-10-15 DIAGNOSIS — O479 False labor, unspecified: Secondary | ICD-10-CM | POA: Diagnosis present

## 2020-10-15 DIAGNOSIS — O99824 Streptococcus B carrier state complicating childbirth: Secondary | ICD-10-CM | POA: Diagnosis present

## 2020-10-15 DIAGNOSIS — O26893 Other specified pregnancy related conditions, third trimester: Secondary | ICD-10-CM | POA: Diagnosis present

## 2020-10-15 DIAGNOSIS — O9081 Anemia of the puerperium: Secondary | ICD-10-CM | POA: Diagnosis not present

## 2020-10-15 LAB — RPR: RPR Ser Ql: NONREACTIVE

## 2020-10-15 LAB — CBC
HCT: 34.2 % — ABNORMAL LOW (ref 36.0–46.0)
Hemoglobin: 12.7 g/dL (ref 12.0–15.0)
MCH: 35.1 pg — ABNORMAL HIGH (ref 26.0–34.0)
MCHC: 37.1 g/dL — ABNORMAL HIGH (ref 30.0–36.0)
MCV: 94.5 fL (ref 80.0–100.0)
Platelets: 218 10*3/uL (ref 150–400)
RBC: 3.62 MIL/uL — ABNORMAL LOW (ref 3.87–5.11)
RDW: 13.2 % (ref 11.5–15.5)
WBC: 15.2 10*3/uL — ABNORMAL HIGH (ref 4.0–10.5)
nRBC: 0 % (ref 0.0–0.2)

## 2020-10-15 LAB — RAPID HIV SCREEN (HIV 1/2 AB+AG)
HIV 1/2 Antibodies: NONREACTIVE
HIV-1 P24 Antigen - HIV24: NONREACTIVE

## 2020-10-15 LAB — RESP PANEL BY RT-PCR (FLU A&B, COVID) ARPGX2
Influenza A by PCR: NEGATIVE
Influenza B by PCR: NEGATIVE
SARS Coronavirus 2 by RT PCR: NEGATIVE

## 2020-10-15 LAB — TYPE AND SCREEN
ABO/RH(D): A POS
Antibody Screen: NEGATIVE

## 2020-10-15 MED ORDER — FENTANYL-BUPIVACAINE-NACL 0.5-0.125-0.9 MG/250ML-% EP SOLN
EPIDURAL | Status: DC | PRN
  Administered 2020-10-15: 12 mL/h via EPIDURAL

## 2020-10-15 MED ORDER — SODIUM CHLORIDE 0.9 % IV SOLN
INTRAVENOUS | Status: DC | PRN
  Administered 2020-10-15 (×2): 5 mL via EPIDURAL

## 2020-10-15 MED ORDER — LACTATED RINGERS IV SOLN: INTRAVENOUS | Status: DC

## 2020-10-15 MED ORDER — OXYTOCIN-SODIUM CHLORIDE 30-0.9 UT/500ML-% IV SOLN
2.5000 [IU]/h | INTRAVENOUS | Status: DC
  Administered 2020-10-15 (×2): 2.5 [IU]/h via INTRAVENOUS
  Filled 2020-10-15: qty 1000

## 2020-10-15 MED ORDER — DIBUCAINE (PERIANAL) 1 % EX OINT
1.0000 "application " | TOPICAL_OINTMENT | CUTANEOUS | Status: DC | PRN
  Filled 2020-10-15: qty 28

## 2020-10-15 MED ORDER — TERBUTALINE SULFATE 1 MG/ML IJ SOLN: 0.2500 mg | Freq: Once | INTRAMUSCULAR | Status: DC | PRN

## 2020-10-15 MED ORDER — ONDANSETRON HCL 4 MG PO TABS
4.0000 mg | ORAL_TABLET | ORAL | Status: DC | PRN
  Filled 2020-10-15: qty 1

## 2020-10-15 MED ORDER — SODIUM CHLORIDE 0.9 % IV SOLN
INTRAVENOUS | Status: AC
  Filled 2020-10-15: qty 5

## 2020-10-15 MED ORDER — BENZOCAINE-MENTHOL 20-0.5 % EX AERO
1.0000 "application " | INHALATION_SPRAY | CUTANEOUS | Status: DC | PRN
  Filled 2020-10-15: qty 56

## 2020-10-15 MED ORDER — ONDANSETRON HCL 4 MG/2ML IJ SOLN: 4.0000 mg | INTRAMUSCULAR | Status: DC | PRN

## 2020-10-15 MED ORDER — OXYTOCIN-SODIUM CHLORIDE 30-0.9 UT/500ML-% IV SOLN
INTRAVENOUS | Status: AC
  Filled 2020-10-15: qty 500

## 2020-10-15 MED ORDER — SOD CITRATE-CITRIC ACID 500-334 MG/5ML PO SOLN: 30.0000 mL | ORAL | Status: DC | PRN

## 2020-10-15 MED ORDER — LIDOCAINE HCL (PF) 1 % IJ SOLN
INTRAMUSCULAR | Status: AC
  Filled 2020-10-15: qty 30

## 2020-10-15 MED ORDER — BUTORPHANOL TARTRATE 1 MG/ML IJ SOLN: 2.0000 mg | INTRAMUSCULAR | Status: DC | PRN

## 2020-10-15 MED ORDER — ACETAMINOPHEN 325 MG PO TABS: 650.0000 mg | ORAL_TABLET | ORAL | Status: DC | PRN

## 2020-10-15 MED ORDER — FENTANYL-BUPIVACAINE-NACL 0.5-0.125-0.9 MG/250ML-% EP SOLN
EPIDURAL | Status: AC
  Filled 2020-10-15: qty 250

## 2020-10-15 MED ORDER — SODIUM CHLORIDE 0.9% FLUSH: 3.0000 mL | INTRAVENOUS | Status: DC | PRN

## 2020-10-15 MED ORDER — ONDANSETRON HCL 4 MG/2ML IJ SOLN: 4.0000 mg | Freq: Four times a day (QID) | INTRAMUSCULAR | Status: DC | PRN

## 2020-10-15 MED ORDER — SODIUM CHLORIDE 0.9 % IV SOLN
250.0000 mL | INTRAVENOUS | Status: DC | PRN
Start: 2020-10-15 — End: 2020-10-16

## 2020-10-15 MED ORDER — OXYCODONE-ACETAMINOPHEN 5-325 MG PO TABS: 1.0000 | ORAL_TABLET | ORAL | Status: DC | PRN

## 2020-10-15 MED ORDER — COCONUT OIL OIL
1.0000 "application " | TOPICAL_OIL | Status: DC | PRN
  Administered 2020-10-15: 1 via TOPICAL
  Filled 2020-10-15: qty 120

## 2020-10-15 MED ORDER — OXYTOCIN BOLUS FROM INFUSION
333.0000 mL | Freq: Once | INTRAVENOUS | Status: AC
Start: 2020-10-15 — End: 2020-10-15
  Administered 2020-10-15: 333 mL via INTRAVENOUS

## 2020-10-15 MED ORDER — INFLUENZA VAC SPLIT QUAD 0.5 ML IM SUSY
0.5000 mL | PREFILLED_SYRINGE | INTRAMUSCULAR | Status: AC
  Administered 2020-10-17: 0.5 mL via INTRAMUSCULAR
  Filled 2020-10-15: qty 0.5

## 2020-10-15 MED ORDER — ACETAMINOPHEN 325 MG PO TABS
650.0000 mg | ORAL_TABLET | ORAL | Status: DC | PRN
  Administered 2020-10-15: 650 mg via ORAL
  Filled 2020-10-15: qty 2

## 2020-10-15 MED ORDER — OXYTOCIN 10 UNIT/ML IJ SOLN
INTRAMUSCULAR | Status: AC
  Filled 2020-10-15: qty 2

## 2020-10-15 MED ORDER — LIDOCAINE HCL (PF) 1 % IJ SOLN: 30.0000 mL | INTRAMUSCULAR | Status: DC | PRN

## 2020-10-15 MED ORDER — LIDOCAINE-EPINEPHRINE (PF) 1.5 %-1:200000 IJ SOLN
INTRAMUSCULAR | Status: DC | PRN
  Administered 2020-10-15: 3 mL via EPIDURAL

## 2020-10-15 MED ORDER — SIMETHICONE 80 MG PO CHEW: 80.0000 mg | CHEWABLE_TABLET | ORAL | Status: DC | PRN

## 2020-10-15 MED ORDER — DIPHENHYDRAMINE HCL 25 MG PO CAPS: 25.0000 mg | ORAL_CAPSULE | Freq: Four times a day (QID) | ORAL | Status: DC | PRN

## 2020-10-15 MED ORDER — IBUPROFEN 600 MG PO TABS
600.0000 mg | ORAL_TABLET | Freq: Four times a day (QID) | ORAL | Status: DC
  Administered 2020-10-15 – 2020-10-17 (×9): 600 mg via ORAL
  Filled 2020-10-15 (×9): qty 1

## 2020-10-15 MED ORDER — LIDOCAINE HCL (PF) 1 % IJ SOLN
INTRAMUSCULAR | Status: DC | PRN
  Administered 2020-10-15: 1 mL via SUBCUTANEOUS

## 2020-10-15 MED ORDER — SENNOSIDES-DOCUSATE SODIUM 8.6-50 MG PO TABS
2.0000 | ORAL_TABLET | ORAL | Status: DC
  Administered 2020-10-15 – 2020-10-17 (×3): 2 via ORAL
  Filled 2020-10-15 (×3): qty 2

## 2020-10-15 MED ORDER — SODIUM CHLORIDE 0.9 % IV SOLN
5.0000 10*6.[IU] | Freq: Once | INTRAVENOUS | Status: AC
  Administered 2020-10-15: 5 10*6.[IU] via INTRAVENOUS

## 2020-10-15 MED ORDER — ZOLPIDEM TARTRATE 5 MG PO TABS: 5.0000 mg | ORAL_TABLET | Freq: Every evening | ORAL | Status: DC | PRN

## 2020-10-15 MED ORDER — OXYTOCIN-SODIUM CHLORIDE 30-0.9 UT/500ML-% IV SOLN: 1.0000 m[IU]/min | INTRAVENOUS | Status: DC

## 2020-10-15 MED ORDER — SODIUM CHLORIDE 0.9% FLUSH: 3.0000 mL | Freq: Two times a day (BID) | INTRAVENOUS | Status: DC

## 2020-10-15 MED ORDER — AMMONIA AROMATIC IN INHA
RESPIRATORY_TRACT | Status: AC
  Filled 2020-10-15: qty 10

## 2020-10-15 MED ORDER — MISOPROSTOL 200 MCG PO TABS
ORAL_TABLET | ORAL | Status: AC
  Filled 2020-10-15: qty 4

## 2020-10-15 MED ORDER — OXYCODONE-ACETAMINOPHEN 5-325 MG PO TABS: 2.0000 | ORAL_TABLET | ORAL | Status: DC | PRN

## 2020-10-15 MED ORDER — PENICILLIN G POT IN DEXTROSE 60000 UNIT/ML IV SOLN
3.0000 10*6.[IU] | INTRAVENOUS | Status: DC
  Administered 2020-10-15: 3 10*6.[IU] via INTRAVENOUS
  Filled 2020-10-15: qty 50

## 2020-10-15 MED ORDER — WITCH HAZEL-GLYCERIN EX PADS
1.0000 "application " | MEDICATED_PAD | CUTANEOUS | Status: DC | PRN
  Filled 2020-10-15 (×2): qty 100

## 2020-10-15 MED ORDER — LACTATED RINGERS IV SOLN: 500.0000 mL | INTRAVENOUS | Status: DC | PRN

## 2020-10-15 NOTE — Discharge Summary (Signed)
Postpartum Discharge Summary    Patient Name: Patricia Nixon DOB: 08/16/1990 MRN: [id number]  Date of admission: 10/15/2020 Delivery date:10/15/2020  Delivering provider: HARRIS, ROBERT P  Date of discharge: 10/17/2020  Admitting diagnosis: Uterine contractions [O47.9] Intrauterine pregnancy: [redacted]w[redacted]d     Secondary diagnosis:  Principal Problem:   Encounter for supervision of normal first pregnancy in third trimester Active Problems:   Uterine contractions   Normal labor and delivery   [redacted] weeks gestation of pregnancy   Third degree laceration of perineum during delivery, postpartum  Additional problems: none    Discharge diagnosis: Term Pregnancy Delivered                                              Post partum procedures: none Augmentation: AROM and Pitocin Complications: None  Hospital course: Onset of Labor With Vaginal Delivery      30 y.o. yo G1P0 at [redacted]w[redacted]d was admitted in Active Labor on 10/15/2020. Patient had an uncomplicated labor course as follows:  Membrane Rupture Time/Date: 7:11 AM ,10/15/2020   Delivery Method:Vaginal, Vacuum (Extractor)  Episiotomy: None  Lacerations:  3rd degree;Perineal  Patient had an uncomplicated postpartum course.  She is ambulating, tolerating a regular diet, passing flatus, and urinating well. Patient is discharged home in stable condition on 10/17/20.  Newborn Data: Birth date:10/15/2020  Birth time:1:11 PM  Gender:Female  "Max James" Living status:Living  Apgars:8 ,9  Weight:3740 g   Magnesium Sulfate received: No BMZ received: No Rhophylac:No MMR:No T-DaP:Given prenatally Flu: Declined Transfusion:No  Physical exam  Vitals:   10/16/20 0336 10/16/20 0747 10/16/20 1550 10/16/20 2257  BP: (!) 92/59 109/80 105/78 95/60  Pulse: 65 77 79 65  Resp: 18 17 18 18  Temp: (!) 97.5 F (36.4 C) 97.7 F (36.5 C) 99.3 F (37.4 C) 97.7 F (36.5 C)  TempSrc: Oral Oral Oral Oral  SpO2: 99% 100%  100%  Weight:      Height:       General:  alert, cooperative, and no distress Lochia: appropriate Uterine Fundus: firm Incision: N/A DVT Evaluation: No evidence of DVT seen on physical exam. No cords or calf tenderness. No significant calf/ankle edema. Labs: Lab Results  Component Value Date   WBC 14.6 (H) 10/16/2020   HGB 10.4 (L) 10/16/2020   HCT 28.5 (L) 10/16/2020   MCV 96.0 10/16/2020   PLT 169 10/16/2020   No flowsheet data found. Edinburgh Score: Edinburgh Postnatal Depression Scale Screening Tool 10/16/2020  I have been able to laugh and see the funny side of things. 0  I have looked forward with enjoyment to things. 1  I have blamed myself unnecessarily when things went wrong. 2  I have been anxious or worried for no good reason. 2  I have felt scared or panicky for no good reason. 1  Things have been getting on top of me. 1  I have been so unhappy that I have had difficulty sleeping. 1  I have felt sad or miserable. 1  I have been so unhappy that I have been crying. 1  The thought of harming myself has occurred to me. 1  Edinburgh Postnatal Depression Scale Total 11      After visit meds:  Allergies as of 10/17/2020   No Known Allergies      Medication List     TAKE these medications    acetaminophen   500 MG tablet Commonly known as: TYLENOL Take 2 tablets (1,000 mg total) by mouth every 8 (eight) hours as needed for mild pain or fever.   ibuprofen 600 MG tablet Commonly known as: ADVIL Take 1 tablet (600 mg total) by mouth every 6 (six) hours as needed for mild pain, cramping or moderate pain.   multivitamin-prenatal 27-0.8 MG Tabs tablet Take 1 tablet by mouth daily at 12 noon.               Discharge Care Instructions  (From admission, onward)           Start     Ordered   10/17/20 0000  Discharge wound care:       Comments: Perform wound care instructions   10/17/20 0738             Discharge home in stable condition Infant Feeding: Bottle and Breast Infant  Disposition:home with mother Discharge instruction: per After Visit Summary and Postpartum booklet. Activity: Advance as tolerated. Pelvic rest for 6 weeks.  Diet: routine diet Anticipated Birth Control: POPs Postpartum Appointment:6 weeks Additional Postpartum F/U: 2 week postpartum depression checkup due to Edinburgh of 11.  Future Appointments: Future Appointments  Date Time Provider Department Center  10/19/2020 11:30 AM Fryer, Margaret M, CNM [facility] None   Follow up Visit:  Follow-up Information     Harris, Robert P, MD. Schedule an appointment as soon as possible for a visit in 2 week(s).   Specialty: Obstetrics and Gynecology Why: For Post Partum Mood Check Contact information: [address] [place] [address] [phone]                 SIGNED:   , MD, FACOG [facility], [facility] 10/17/2020 7:42 AM    

## 2020-10-15 NOTE — Lactation Note (Addendum)
This note was copied from a baby's chart. Lactation Consultation Note  Patient Name: Patricia Nixon [id number] Date: 10/15/2020 Reason for consult: L&D Initial assessment;Primapara;1st time breastfeeding;Term Age:31 hours  Maternal Data Has patient been taught Hand Expression?: Yes  Feeding Mother's Current Feeding Choice: Breast Milk Baby very fussy, has not latched since delivery, mom has inverted nipples, after attempting without, attempted with 20 mm nipple shield but baby still will not latch and suck, just cries, feeding ended and baby place skin to skin on mom's chest LATCH Score Latch: Too sleepy or reluctant, no latch achieved, no sucking elicited.  Audible Swallowing: None  Type of Nipple: Inverted  Comfort (Breast/Nipple): Soft / non-tender  Hold (Positioning): Full assist, staff holds infant at breast  LATCH Score: 2   Lactation Tools Discussed/Used Tools: Nipple Shields Nipple shield size: 20  Interventions Interventions: Breast feeding basics reviewed;Assisted with latch;Skin to skin;Hand express;Adjust position;Support pillows;Education  Discharge    Consult Status Consult Status: Follow-up from L&D    Dyann Kief 10/15/2020, 3:22 PM

## 2020-10-15 NOTE — Anesthesia Procedure Notes (Signed)
Epidural Patient location during procedure: OB Start time: 10/15/2020 8:03 AM End time: 10/15/2020 8:06 AM  Staffing Anesthesiologist: Ardys Hataway, Cleda Mccreedy, MD Performed: anesthesiologist   Preanesthetic Checklist Completed: patient identified, IV checked, site marked, risks and benefits discussed, surgical consent, monitors and equipment checked, pre-op evaluation and timeout performed  Epidural Patient position: sitting Prep: ChloraPrep Patient monitoring: heart rate, continuous pulse ox and blood pressure Approach: midline Location: L3-L4 Injection technique: LOR saline  Needle:  Needle type: Tuohy  Needle gauge: 17 G Needle length: 9 cm and 9 Needle insertion depth: 5 cm Catheter type: closed end flexible Catheter size: 19 Gauge Catheter at skin depth: 10 cm Test dose: negative and 1.5% lidocaine with Epi 1:200 K  Assessment Sensory level: T10 Events: blood not aspirated, injection not painful, no injection resistance, no paresthesia and negative IV test  Additional Notes 1 attempt Pt. Evaluated and documentation done after procedure finished. Patient identified. Risks/Benefits/Options discussed with patient including but not limited to bleeding, infection, nerve damage, paralysis, failed block, incomplete pain control, headache, blood pressure changes, nausea, vomiting, reactions to medication both or allergic, itching and postpartum back pain. Confirmed with bedside nurse the patient's most recent platelet count. Confirmed with patient that they are not currently taking any anticoagulation, have any bleeding history or any family history of bleeding disorders. Patient expressed understanding and wished to proceed. All questions were answered. Sterile technique was used throughout the entire procedure. Please see nursing notes for vital signs. Test dose was given through epidural catheter and negative prior to continuing to dose epidural or start infusion. Warning signs of  high block given to the patient including shortness of breath, tingling/numbness in hands, complete motor block, or any concerning symptoms with instructions to call for help. Patient was given instructions on fall risk and not to get out of bed. All questions and concerns addressed with instructions to call with any issues or inadequate analgesia.    Patient tolerated the insertion well without immediate complications.Reason for block:procedure for pain

## 2020-10-15 NOTE — H&P (Signed)
Patricia Nixon is an 30 y.o. female.   Chief Complaint: uterine contractions HPI: She presents today for complaints of uterine contractions. She reports her contractions worsened around 2 am She is having back pain with the contractions. She feels the contractions every 1-3 minutes.She reports diminished fetal movements over the last 2 days. She denies vaginal bleeding. She denies leakage of fluid.  pregnancy 1 Problems (from 02/23/20 to present)     Problem Noted Resolved   Encounter for supervision of normal first pregnancy in third trimester 02/23/2020 by Zipporah Plants, CNM No   Overview Addendum 09/18/2020  9:40 AM by Mirna Mires, CNM     Nursing Staff Provider  Office Location  [facility] Dating   LMP  Language  English Anatomy US  Complete, normal  Flu Vaccine   declined Genetic Screen  NIPS: declined   TDaP vaccine   08/05/2020 Hgb A1C or  GTT Third trimester : 166, passed the 3hr GTT  Rhogam   n/a   LAB RESULTS   Feeding Plan  breast Blood Type   A +  Contraception POP Antibody   neg  Circumcision  Rubella   immune  Pediatrician   RPR   non-reactive  Support Person  Husband HBsAg   negative  Prenatal Classes  Discussed HIV   non-reactive    Varicella  immune  BTL Consent no GBS  (For PCN allergy, check sensitivities) positive       VBAC Consent n/a Pap  NIL HPV neg 02/23/20    Hgb Electro   n/a  Covid Vaccine Counseled -declined                           History reviewed. No pertinent past medical history.  History reviewed. No pertinent surgical history.  History reviewed. No pertinent family history. Social History:  reports that she has been smoking e-cigarettes. She has never used smokeless tobacco. No history on file for alcohol use and drug use.  Allergies: Not on File  Medications Prior to Admission  Medication Sig Dispense Refill   Prenatal Vit-Fe Fumarate-FA (MULTIVITAMIN-PRENATAL) 27-0.8 MG TABS tablet Take 1 tablet by mouth daily at 12 noon.       No results found for this or any previous visit (from the past 48 hour(s)). No results found.  Review of Systems  Constitutional:  Negative for chills and fever.  HENT:  Negative for congestion, hearing loss and sinus pain.   Respiratory:  Negative for cough, shortness of breath and wheezing.   Cardiovascular:  Negative for chest pain, palpitations and leg swelling.  Gastrointestinal:  Negative for abdominal pain, constipation, diarrhea, nausea and vomiting.  Genitourinary:  Negative for dysuria, flank pain, frequency, hematuria and urgency.  Musculoskeletal:  Negative for back pain.  Skin:  Negative for rash.  Neurological:  Negative for dizziness and headaches.  Psychiatric/Behavioral:  Negative for suicidal ideas. The patient is not nervous/anxious.    Blood pressure 113/71, pulse 92, temperature 98.7 F (37.1 C), temperature source Oral, resp. rate 16, height 5\' 2"  (1.575 m), weight 71.7 kg, last menstrual period 01/08/2020. Physical Exam Vitals and nursing note reviewed.  Constitutional:      Appearance: Normal appearance. She is well-developed.  HENT:     Head: Normocephalic and atraumatic.  Cardiovascular:     Rate and Rhythm: Normal rate and regular rhythm.  Pulmonary:     Effort: Pulmonary effort is normal.     Breath sounds: Normal breath  sounds.  Abdominal:     General: Bowel sounds are normal.     Palpations: Abdomen is soft.  Musculoskeletal:        General: Normal range of motion.  Skin:    General: Skin is warm and dry.  Neurological:     Mental Status: She is alert and oriented to person, place, and time.  Psychiatric:        Behavior: Behavior normal.        Thought Content: Thought content normal.        Judgment: Judgment normal.    NST: 150 bpm baseline, moderate variability, 15x15 accelerations, no decelerations. Tocometer : every 1-3 minutes  SVE by nursing: 3.5/70/-2  Assessment/Plan 30 y.o. G1P0 [redacted]w[redacted]d early labor.    Expected management  of labor. Will augment with pitocin if needed.   Normal fetal monitoring per unit policy. Reviewed option for pain management with patient.   Standard admission labs ordered.  GBS: positive. PCN ordered.   Natale Milch, MD 10/15/2020, 6:27 AM

## 2020-10-15 NOTE — Lactation Note (Signed)
This note was copied from a baby's chart. Lactation Consultation Note  Patient Name: Patricia Nixon [id number] Date: 10/15/2020 Reason for consult: Follow-up assessment Age:31 hours  Maternal Data Has patient been taught Hand Expression?: Yes Does the patient have breastfeeding experience prior to this delivery?: No  Feeding Mother's Current Feeding Choice: Breast Milk Mom states baby has been able to latch to right breast, right nipple everts easier and baby more aggressive at present, latches easily with few tries, nursed approx 15 min, came off breast, baby has not latched to left breast, nipple more inverted, pumped with manual pump but reverts back quickly, mom turned to left side and baby positioned beside mom, with gentle shaping of breast , baby able to latch, few attempts to get deep latch but was able to sustain latch x 10 min LATCH Score Latch: Repeated attempts needed to sustain latch, nipple held in mouth throughout feeding, stimulation needed to elicit sucking reflex.  Audible Swallowing: A few with stimulation  Type of Nipple: Inverted  Comfort (Breast/Nipple): Soft / non-tender  Hold (Positioning): Assistance needed to correctly position infant at breast and maintain latch.  LATCH Score: 5   Lactation Tools Discussed/Used  LC name and no written onwhite board  Interventions Interventions: Breast feeding basics reviewed;Assisted with latch;Skin to skin;Hand express;Adjust position;Support pillows;Coconut oil;Education  Discharge Pump: Personal WIC Program: Yes  Consult Status Consult Status: Follow-up Date: 10/16/20 Follow-up type: In-patient    Dyann Kief 10/15/2020, 8:46 PM

## 2020-10-16 LAB — CBC
HCT: 28.5 % — ABNORMAL LOW (ref 36.0–46.0)
Hemoglobin: 10.4 g/dL — ABNORMAL LOW (ref 12.0–15.0)
MCH: 35 pg — ABNORMAL HIGH (ref 26.0–34.0)
MCHC: 36.5 g/dL — ABNORMAL HIGH (ref 30.0–36.0)
MCV: 96 fL (ref 80.0–100.0)
Platelets: 169 10*3/uL (ref 150–400)
RBC: 2.97 MIL/uL — ABNORMAL LOW (ref 3.87–5.11)
RDW: 13.3 % (ref 11.5–15.5)
WBC: 14.6 10*3/uL — ABNORMAL HIGH (ref 4.0–10.5)
nRBC: 0 % (ref 0.0–0.2)

## 2020-10-16 NOTE — Progress Notes (Signed)
Obstetric Postpartum Daily Progress Note  Chief Complaint: Postpartum care after vaginal delivery  Subjective:  30 y.o. G1P1001 postpartum day #1 status post vacuum assisted vaginal delivery, complicated by a 3rd degree laceration.  She is ambulating, is tolerating po, is voiding spontaneously.  Her pain is well controlled on PO pain medications. Her lochia is less than menses.   Medications SCHEDULED MEDICATIONS   ibuprofen  600 mg Oral Q6H   influenza vac split quadrivalent PF  0.5 mL Intramuscular Tomorrow-1000   senna-docusate  2 tablet Oral Q24H   sodium chloride flush  3 mL Intravenous Q12H    MEDICATION INFUSIONS   sodium chloride      PRN MEDICATIONS  sodium chloride flush **AND** sodium chloride flush **AND** sodium chloride, acetaminophen, benzocaine-Menthol, coconut oil, witch hazel-glycerin **AND** dibucaine, diphenhydrAMINE, ondansetron **OR** ondansetron (ZOFRAN) IV, oxyCODONE-acetaminophen, oxyCODONE-acetaminophen, simethicone, zolpidem    Objective:   Vitals:   10/15/20 1935 10/15/20 2334 10/16/20 0336 10/16/20 0747  BP: (!) 93/58 95/73 (!) 92/59 109/80  Pulse: 87 67 65 77  Resp: 18 18 18 17   Temp: 98.5 F (36.9 C) (!) 97.5 F (36.4 C) (!) 97.5 F (36.4 C) 97.7 F (36.5 C)  TempSrc: Oral Oral Oral Oral  SpO2: 99% 100% 99% 100%  Weight:      Height:        Current Vital Signs 24h Vital Sign Ranges  T 97.7 F (36.5 C) Temp  Avg: 98.5 F (36.9 C)  Min: 97.5 F (36.4 C)  Max: 101.7 F (38.7 C)  BP 109/80 BP  Min: 84/45  Max: 114/65  HR 77 Pulse  Avg: 77.3  Min: 65  Max: 88  RR 17 Resp  Avg: 17.9  Min: 17  Max: 18  SaO2 100 % Room Air SpO2  Avg: 99.5 %  Min: 99 %  Max: 100 %       24 Hour I/O Current Shift I/O  Time Ins Outs 09/23 0701 - 09/24 0700 In: -  Out: 1530 [Urine:975] No intake/output data recorded.  General: NAD Pulmonary: no increased work of breathing Abdomen: non-distended, non-tender, fundus firm at level of umbilicus Extremities: no  edema, no erythema, no tenderness  Labs:  Recent Labs  Lab 10/15/20 0638 10/16/20 0557  WBC 15.2* 14.6*  HGB 12.7 10.4*  HCT 34.2* 28.5*  PLT 218 169     Assessment:   30 y.o. G1P1001 postpartum day # 1 status post VAVD complicated by 3rd degree perineal laceration  Plan:   1) Acute blood loss anemia - hemodynamically stable and asymptomatic - po ferrous sulfate  2) A POS /   / Rubella 1.35 (01/31 1559)/ Varicella Immune  3) TDAP status received 08/05/2020, declined flu vaccine  4) breast and formula  /Contraception = oral progesterone-only contraceptive  5) Disposition: home tomorrow  08/07/2020, MD 10/16/2020 12:11 PM

## 2020-10-16 NOTE — Anesthesia Postprocedure Evaluation (Signed)
Anesthesia Post Note  Patient: Patricia Nixon  Procedure(s) Performed: AN AD HOC LABOR EPIDURAL  Anesthetic complications: no   No notable events documented.   Last Vitals:  Vitals:   10/16/20 0336 10/16/20 0747  BP: (!) 92/59 109/80  Pulse: 65 77  Resp: 18 17  Temp: (!) 36.4 C 36.5 C  SpO2: 99% 100%    Last Pain:  Vitals:   10/16/20 0747  TempSrc: Oral  PainSc:                  Johny Drilling

## 2020-10-16 NOTE — Anesthesia Preprocedure Evaluation (Addendum)
Anesthesia Evaluation  Patient identified by MRN, date of birth, ID band Patient awake    Reviewed: Allergy & Precautions, NPO status , Patient's Chart, lab work & pertinent test results  Airway Mallampati: III  TM Distance: >3 FB Neck ROM: full    Dental  (+) Chipped   Pulmonary neg pulmonary ROS, Current Smoker,    Pulmonary exam normal        Cardiovascular Exercise Tolerance: Good negative cardio ROS Normal cardiovascular exam     Neuro/Psych    GI/Hepatic negative GI ROS,   Endo/Other    Renal/GU   negative genitourinary   Musculoskeletal   Abdominal   Peds  Hematology negative hematology ROS (+)   Anesthesia Other Findings No past medical history on file.  No past surgical history on file.     Reproductive/Obstetrics (+) Pregnancy                            Anesthesia Physical Anesthesia Plan  ASA: 2  Anesthesia Plan: Epidural   Post-op Pain Management:    Induction:   PONV Risk Score and Plan:   Airway Management Planned: Natural Airway  Additional Equipment:   Intra-op Plan:   Post-operative Plan:   Informed Consent: I have reviewed the patients History and Physical, chart, labs and discussed the procedure including the risks, benefits and alternatives for the proposed anesthesia with the patient or authorized representative who has indicated his/her understanding and acceptance.     Dental Advisory Given  Plan Discussed with: Anesthesiologist  Anesthesia Plan Comments: (Patient reports no bleeding problems and no anticoagulant use.   Patient consented for risks of anesthesia including but not limited to:  - adverse reactions to medications - risk of bleeding, infection and or nerve damage from epidural that could lead to paralysis - risk of headache or failed epidural - nerve damage due to positioning - that if epidural is used for C-section that  there is a chance of epidural failure requiring spinal placement or conversion to GA - Damage to heart, brain, lungs, other parts of body or loss of life  Patient voiced understanding.)        Anesthesia Quick Evaluation

## 2020-10-17 MED ORDER — ACETAMINOPHEN 500 MG PO TABS: 1000.0000 mg | ORAL_TABLET | Freq: Three times a day (TID) | ORAL | Status: DC | PRN

## 2020-10-17 MED ORDER — IBUPROFEN 600 MG PO TABS: 600.0000 mg | ORAL_TABLET | Freq: Four times a day (QID) | ORAL | 0 refills | Status: DC | PRN

## 2020-10-17 NOTE — TOC Initial Note (Signed)
Transition of Care [facility]) - Initial/Assessment Note    Patient Details  Name: Patricia Nixon MRN: [id number] Date of Birth: 1990/11/09  Transition of Care [facility]) CM/SW Contact:    Marina Goodell Phone Number: [phone] 10/17/2020, 12:26 PM  Clinical Narrative:                  CSW spoke with patient to discuss discharge planning and resources.  Patent stated she will stay home with the baby for the foreseeable future.  Patient stated she has family supports and has everything she needs for the baby.  Patient stated she has already contacted DSS about Medicaid for the baby and will contact the health department to sign up for [facility]. CSW discussed increase in anxiety or feelings od depression or distancing from others or the baby and recommended the patient speak with her family, PCP or OBGYN if she experienced any of these symptoms.  Patient verbalized understanding.  Patient did not have any other questions or concerns.  CSW update Unit RN.       Patient Goals and CMS Choice        Expected Discharge Plan and Services           Expected Discharge Date: 10/17/20                                    Prior Living Arrangements/Services                       Activities of Daily Living Home Assistive Devices/Equipment: None ADL Screening (condition at time of admission) Patient's cognitive ability adequate to safely complete daily activities?: Yes Is the patient deaf or have difficulty hearing?: No Does the patient have difficulty seeing, even when wearing glasses/contacts?: No Does the patient have difficulty concentrating, remembering, or making decisions?: No Patient able to express need for assistance with ADLs?: Yes Does the patient have difficulty dressing or bathing?: No Independently performs ADLs?: Yes (appropriate for developmental age) Does the patient have difficulty walking or climbing stairs?: No Weakness of Legs: None Weakness of Arms/Hands:  None  Permission Sought/Granted                  Emotional Assessment              Admission diagnosis:  Uterine contractions [O47.9] Patient Active Problem List   Diagnosis Date Noted   Third degree laceration of perineum during delivery, postpartum 10/17/2020   Uterine contractions 10/15/2020   Normal labor and delivery    [redacted] weeks gestation of pregnancy    Encounter for supervision of normal first pregnancy in third trimester 02/23/2020   PCP:  Patient, No Pcp Per (Inactive) Pharmacy:   [id number] Nicholes Rough, [place] - [address] AT [facility] [address] [place] [address] Phone: [phone] Fax: [phone]     Social Determinants of Health (SDOH) Interventions    Readmission Risk Interventions No flowsheet data found.

## 2020-10-17 NOTE — Progress Notes (Signed)
DC instructions given to mom. No needs or concerns expressed. Iv already discontinued. Dc'd home via [facility] with baby.

## 2020-10-18 ENCOUNTER — Telehealth: Payer: Self-pay

## 2020-10-18 NOTE — Telephone Encounter (Signed)
Transition Care Management Unsuccessful Follow-up Telephone Call  Date of discharge and from where:  10/17/2020-[facility]  Attempts:  1st Attempt  Reason for unsuccessful TCM follow-up call:  Left voice message    

## 2020-10-19 ENCOUNTER — Other Ambulatory Visit: Payer: Self-pay

## 2020-10-19 ENCOUNTER — Ambulatory Visit (INDEPENDENT_AMBULATORY_CARE_PROVIDER_SITE_OTHER): Payer: Medicaid Other | Admitting: Obstetrics

## 2020-10-19 ENCOUNTER — Encounter: Payer: Self-pay | Admitting: Obstetrics

## 2020-10-19 ENCOUNTER — Encounter: Payer: Medicaid Other | Admitting: Obstetrics

## 2020-10-19 NOTE — Progress Notes (Signed)
Obstetrics & Gynecology Office Visit   Chief Complaint:  Chief Complaint  Patient presents with   Postpartum Care    History of Present Illness: Patricia Nixon presents for a one week postpartum visit. She had a vacuum assisted delivery a week ago, and with it, a partial third degree tear. She returns to the office for a perineal eval and for a mood check. She smiles a lot during our visit, and also looks quite tired. Not getting much sleep.She has inverted nipples and so shooses to pump her milk rather than struggle with direct latch on.   Review of Systems:  Review of Systems  Constitutional: Negative.   HENT: Negative.    Gastrointestinal: Negative.   Genitourinary: Negative.   Musculoskeletal: Negative.   Skin:        Partial third degree lac.  Neurological: Negative.   Endo/Heme/Allergies: Negative.   Psychiatric/Behavioral: Negative.      Past Medical History:  No past medical history on file.  Past Surgical History:  No past surgical history on file.  Gynecologic History: No LMP recorded.  Obstetric History: G1P1001 Delivered via vacuum  Family History:  No family history on file.  Social History:  Social History   Socioeconomic History   Marital status: Married    Spouse name: Not on file   Number of children: Not on file   Years of education: Not on file   Highest education level: Not on file  Occupational History   Not on file  Tobacco Use   Smoking status: Every Day    Types: E-cigarettes   Smokeless tobacco: Never  Substance and Sexual Activity   Alcohol use: Not on file   Drug use: Not on file   Sexual activity: Not on file  Other Topics Concern   Not on file  Social History Narrative   Not on file   Social Determinants of Health   Financial Resource Strain: Not on file  Food Insecurity: Not on file  Transportation Needs: Not on file  Physical Activity: Not on file  Stress: Not on file  Social Connections: Not on file  Intimate Partner  Violence: Not on file    Allergies:  No Known Allergies  Medications: Prior to Admission medications   Medication Sig Start Date End Date Taking? Authorizing Provider  acetaminophen (TYLENOL) 500 MG tablet Take 2 tablets (1,000 mg total) by mouth every 8 (eight) hours as needed for mild pain or fever. 10/17/20  Yes Conard Novak, MD  ibuprofen (ADVIL) 600 MG tablet Take 1 tablet (600 mg total) by mouth every 6 (six) hours as needed for mild pain, cramping or moderate pain. 10/17/20  Yes Conard Novak, MD  Prenatal Vit-Fe Fumarate-FA (MULTIVITAMIN-PRENATAL) 27-0.8 MG TABS tablet Take 1 tablet by mouth daily at 12 noon.   Yes [provider]    Physical Exam Vitals:  Vitals:   10/19/20 1138  BP: 120/70   No LMP recorded.  Physical Exam Constitutional:      Appearance: Normal appearance.  HENT:     Head: Normocephalic and atraumatic.  Cardiovascular:     Rate and Rhythm: Normal rate and regular rhythm.  Pulmonary:     Effort: Pulmonary effort is normal.     Breath sounds: Normal breath sounds.  Abdominal:     General: Abdomen is flat.     Palpations: Abdomen is soft.  Genitourinary:    General: Normal vulva.     Rectum: Normal.     Comments:  Perineum is healing well without edema. + tenderness. Minimal lochia flow. Musculoskeletal:        General: Normal range of motion.     Cervical back: Normal range of motion.  Skin:    General: Skin is warm and dry.  Neurological:     General: No focal deficit present.     Mental Status: She is alert and oriented to person, place, and time.  Psychiatric:        Mood and Affect: Mood normal.        Behavior: Behavior normal.     Comments: Edinburgh 9     Assessment: 30 y.o. G1P1001  for one week postpartum evaluation of third degree perineal laceration after VAVD.   Plan: Problem List Items Addressed This Visit   None   Reassurance given re her perineum. Plan for RTC in 5 weeks for her 6 wk PP visit with  Dr. Tiburcio Pea. Plans POPs for birth control.  Mirna Mires, CNM  10/19/2020 2:42 PM

## 2020-10-25 NOTE — Telephone Encounter (Signed)
Transition Care Management Unsuccessful Follow-up Telephone Call  Date of discharge and from where:  10/17/2020 [facility]  Attempts:  2nd Attempt  Reason for unsuccessful TCM follow-up call:  Voice mail full

## 2020-10-26 NOTE — Telephone Encounter (Signed)
Transition Care Management Unsuccessful Follow-up Telephone Call  Date of discharge and from where:  10/17/2020 from [facility]  Attempts:  3rd Attempt  Reason for unsuccessful TCM follow-up call:  Unable to reach patient

## 2020-11-05 ENCOUNTER — Ambulatory Visit (INDEPENDENT_AMBULATORY_CARE_PROVIDER_SITE_OTHER): Payer: Medicaid Other | Admitting: Obstetrics & Gynecology

## 2020-11-05 ENCOUNTER — Encounter: Payer: Self-pay | Admitting: Obstetrics & Gynecology

## 2020-11-05 ENCOUNTER — Other Ambulatory Visit: Payer: Self-pay

## 2020-11-05 VITALS — BP 100/60 | Ht 62.0 in | Wt 138.0 lb

## 2020-11-05 DIAGNOSIS — F53 Postpartum depression: Secondary | ICD-10-CM | POA: Diagnosis not present

## 2020-11-05 MED ORDER — ESCITALOPRAM OXALATE 10 MG PO TABS: 10.0000 mg | ORAL_TABLET | Freq: Every day | ORAL | 11 refills | Status: DC

## 2020-11-05 NOTE — Patient Instructions (Signed)
Escitalopram Tablets What is this medication? ESCITALOPRAM (es sye TAL oh pram) treats depression and anxiety. It increases the amount of serotonin in the brain, a hormone that helps regulate mood. It belongs to a group of medications called SSRIs. This medicine may be used for other purposes; ask your health care provider or pharmacist if you have questions. COMMON BRAND NAME(S): Lexapro What should I tell my care team before I take this medication? They need to know if you have any of these conditions: Bipolar disorder or a family history of bipolar disorder Diabetes Glaucoma Heart disease Kidney or liver disease Receiving electroconvulsive therapy Seizures Suicidal thoughts, plans, or attempt by you or a family member An unusual or allergic reaction to escitalopram, the related medication citalopram, other medications, foods, dyes, or preservatives Pregnant or trying to become pregnant Breast-feeding How should I use this medication? Take this medication by mouth with a glass of water. Follow the directions on the prescription label. You can take it with or without food. If it upsets your stomach, take it with food. Take your medication at regular intervals. Do not take it more often than directed. Do not stop taking this medication suddenly except upon the advice of your care team. Stopping this medication too quickly may cause serious side effects or your condition may worsen. A special MedGuide will be given to you by the pharmacist with each prescription and refill. Be sure to read this information carefully each time. Talk to your care team regarding the use of this medication in children. Special care may be needed. Overdosage: If you think you have taken too much of this medicine contact a poison control center or emergency room at once. NOTE: This medicine is only for you. Do not share this medicine with others. What if I miss a dose? If you miss a dose, take it as soon as you  can. If it is almost time for your next dose, take only that dose. Do not take double or extra doses. What may interact with this medication? Do not take this medication with any of the following: Certain medications for fungal infections like fluconazole, itraconazole, ketoconazole, posaconazole, voriconazole Cisapride Citalopram Dronedarone Linezolid MAOIs like Carbex, Eldepryl, Marplan, Nardil, and Parnate Methylene blue (injected into a vein) Pimozide Thioridazine This medication may also interact with the following: Alcohol Amphetamines Aspirin and aspirin-like medications Carbamazepine Certain medications for depression, anxiety, or psychotic disturbances Certain medications for migraine headache like almotriptan, eletriptan, frovatriptan, naratriptan, rizatriptan, sumatriptan, zolmitriptan Certain medications for sleep Certain medications that treat or prevent blood clots like warfarin, enoxaparin, dalteparin Cimetidine Diuretics Dofetilide Fentanyl Furazolidone Isoniazid Lithium Metoprolol NSAIDs, medications for pain and inflammation, like ibuprofen or naproxen Other medications that prolong the QT interval (cause an abnormal heart rhythm) Procarbazine Rasagiline Supplements like St. John's wort, kava kava, valerian Tramadol Tryptophan Ziprasidone This list may not describe all possible interactions. Give your health care provider a list of all the medicines, herbs, non-prescription drugs, or dietary supplements you use. Also tell them if you smoke, drink alcohol, or use illegal drugs. Some items may interact with your medicine. What should I watch for while using this medication? Tell your care team if your symptoms do not get better or if they get worse. Visit your care team for regular checks on your progress. Because it may take several weeks to see the full effects of this medication, it is important to continue your treatment as prescribed by your care  team. Watch for new or worsening   thoughts of suicide or depression. This includes sudden changes in mood, behaviors, or thoughts. These changes can happen at any time but are more common in the beginning of treatment or after a change in dose. Call your care team right away if you experience these thoughts or worsening depression. Manic episodes may happen in patients with bipolar disorder who take this medication. Watch for changes in feelings or behaviors such as feeling anxious, nervous, agitated, panicky, irritable, hostile, aggressive, impulsive, severely restless, overly excited and hyperactive, or trouble sleeping. These symptoms can happen at any time but are more common in the beginning of treatment or after a change in dose. Call your care team right away if you notice any of these symptoms. You may get drowsy or dizzy. Do not drive, use machinery, or do anything that needs mental alertness until you know how this medication affects you. Do not stand or sit up quickly, especially if you are an older patient. This reduces the risk of dizzy or fainting spells. Alcohol may interfere with the effect of this medication. Avoid alcoholic drinks. Your mouth may get dry. Chewing sugarless gum or sucking hard candy, and drinking plenty of water may help. Contact your care team if the problem does not go away or is severe. What side effects may I notice from receiving this medication? Side effects that you should report to your care team as soon as possible: Allergic reactions-skin rash, itching, hives, swelling of the face, lips, tongue, or throat Bleeding-bloody or black, tar-like stools, red or dark brown urine, vomiting blood or brown material that looks like coffee grounds, small, red or purple spots on skin, unusual bleeding or bruising Heart rhythm changes-fast or irregular heartbeat, dizziness, feeling faint or lightheaded, chest pain, trouble breathing Low sodium level-muscle weakness, fatigue,  dizziness, headache, confusion Serotonin syndrome-irritability, confusion, fast or irregular heartbeat, muscle stiffness, twitching muscles, sweating, high fever, seizure, chills, vomiting, diarrhea Sudden eye pain or change in vision such as blurry vision, seeing halos around lights, vision loss Thoughts of suicide or self-harm, worsening mood, feelings of depression Side effects that usually do not require medical attention (report to your care team if they continue or are bothersome): Change in sex drive or performance Diarrhea Excessive sweating Nausea Tremors or shaking Upset stomach This list may not describe all possible side effects. Call your doctor for medical advice about side effects. You may report side effects to FDA at 1-800-FDA-1088. Where should I keep my medication? Keep out of reach of children and pets. Store at room temperature between 15 and 30 degrees C (59 and 86 degrees F). Throw away any unused medication after the expiration date. NOTE: This sheet is a summary. It may not cover all possible information. If you have questions about this medicine, talk to your doctor, pharmacist, or health care provider.  2022 Elsevier/Gold Standard (2019-12-01 09:53:34)  

## 2020-11-05 NOTE — Progress Notes (Signed)
Subjective:     Patricia Nixon is a 30 y.o. female G1P1 who presents for follow up from delivery with new onset symptoms of depression. Current symptoms include depressed mood, difficulty concentrating, and fatigue. Symptoms have been unchanged but ongoing since 3 weeks ago. Patient denies hopelessness, recurrent thoughts of death, suicidal attempt, suicidal thoughts with specific plan, and suicidal thoughts without plan. Previous treatment includes: none. She complains of the following side effects from the treatment:  no treatment yet .  She lives with in-laws, with some support but also more responsibilities and expectations from them, with her newborn child, husband works and is in school too so very busy.  The following portions of the patient's history were reviewed and updated as appropriate: allergies, current medications, past family history, past medical history, past social history, past surgical history, and problem list.  Review of Systems Pertinent items are noted in HPI.    Objective:    BP 100/60   Ht 5\' 2"  (1.575 m)   Wt 138 lb (62.6 kg)   Breastfeeding No   BMI 25.24 kg/m   General:  alert, cooperative, and no distress  Affect & Behavior:  full facial expressions, good grooming, good insight, normal perception, normal reasoning, normal speech pattern and content, and normal thought patterns flattened affect      Assessment:    Depression, postpartum      Plan:    Medications: Lexapro.  Discussed pros and cons.  Rx.  Info gv. Recommended counseling.  This to address external factors and contributors to her stress and anxiety. Reviewed concept of anxiety as biochemical imbalance of neurotransmitters and rationale for treatment. Instructed patient to contact office or on-call physician promptly should condition worsen or any new symptoms appear and provided on-call telephone numbers. IF THE PATIENT HAS ANY SUICIDAL OR HOMICIDAL IDEATIONS, CALL THE OFFICE, DISCUSS WITH A  SUPPORT MEMBER, OR GO TO THE ER IMMEDIATELY. Patient was agreeable with this plan. Follow up: 3 weeks. Spent 25 minutes (>50% of visit) discussing the risks of anxiety disorder and postpartum depression, the  pathophysiology, etiology, risks, and principles of treatment.   , MD, Annamarie Major Ob/Gyn, [facility] 11/05/2020  10:00 AM

## 2020-11-11 ENCOUNTER — Other Ambulatory Visit: Payer: Self-pay | Admitting: Obstetrics & Gynecology

## 2020-11-11 MED ORDER — BUPROPION HCL ER (XL) 150 MG PO TB24: 150.0000 mg | ORAL_TABLET | Freq: Every day | ORAL | 6 refills | Status: AC

## 2020-11-23 ENCOUNTER — Encounter: Payer: Self-pay | Admitting: Obstetrics & Gynecology

## 2020-11-23 ENCOUNTER — Ambulatory Visit (INDEPENDENT_AMBULATORY_CARE_PROVIDER_SITE_OTHER): Payer: Medicaid Other | Admitting: Obstetrics & Gynecology

## 2020-11-23 ENCOUNTER — Other Ambulatory Visit: Payer: Self-pay

## 2020-11-23 DIAGNOSIS — F53 Postpartum depression: Secondary | ICD-10-CM

## 2020-11-23 MED ORDER — NORETHIN-ETH ESTRAD-FE BIPHAS 1 MG-10 MCG / 10 MCG PO TABS: 1.0000 | ORAL_TABLET | Freq: Every day | ORAL | 0 refills | Status: DC

## 2020-11-23 NOTE — Progress Notes (Signed)
  OBSTETRICS POSTPARTUM CLINIC PROGRESS NOTE  Subjective:     Patricia Nixon is a 30 y.o. G2P1001 female who presents for a postpartum visit. She is 6 weeks postpartum following a Term pregnancy, Single fetus, or Uncomplicated pregnancy and delivery by  VAVD, 3rd degree repair .  I have fully reviewed the prenatal and intrapartum course. Anesthesia: epidural.  Postpartum course has been complicated by uncomplicated.  Baby is feeding by Bottle.  Bleeding: patient has not  resumed menses.  Bowel function is normal. Bladder function is normal.  Patient is not sexually active. Contraception method desired is OCP (estrogen/progesterone).  Postpartum depression screening: negative; she is taking Wellbutrin. Edinburgh 10.  The following portions of the patient's history were reviewed and updated as appropriate: allergies, current medications, past family history, past medical history, past social history, past surgical history, and problem list.  Review of Systems Pertinent items are noted in HPI.  Objective:    BP 120/80   Ht 5\' 2"  (1.575 m)   Wt 135 lb (61.2 kg)   Breastfeeding No   BMI 24.69 kg/m   General:  alert and no distress   Breasts:  inspection negative, no nipple discharge or bleeding, no masses or nodularity palpable  Lungs: clear to auscultation bilaterally  Heart:  regular rate and rhythm, S1, S2 normal, no murmur, click, rub or gallop  Abdomen: soft, non-tender; bowel sounds normal; no masses,  no organomegaly.     Vulva:  normal  Vagina: normal vagina, no discharge, exudate, lesion, or erythema  Cervix:  no cervical motion tenderness and no lesions  Corpus: normal size, contour, position, consistency, mobility, non-tender  Adnexa:  normal adnexa and no mass, fullness, tenderness  Rectal Exam: Not performed.          Assessment:  Post Partum Care visit 1. Postpartum care following vaginal delivery  2. Postpartum depression Cont Wellbutrin  Plan:  See orders and  Patient Instructions Contraceptive counseling for oral contraceptives (estrogen/progesterone) Resume all normal activities Follow up in: 3 months for ANnual w PAP; or as needed.   , MD, Annamarie Major Ob/Gyn, [facility] 11/23/2020  9:57 AM

## 2020-11-23 NOTE — Patient Instructions (Signed)
Norethindrone Acetate; Ethinyl Estradiol; Ferrous Fumarate Capsules or Tablets What is this medication? NORETHINDRONE ACETATE; ETHINYL ESTRADIOL; FERROUS FUMARATE (nor eth IN drone AS e tate; ETH in il es tra DYE ole; FER us FUE ma rate) prevents ovulation and pregnancy. It may also be used to treat acne. It belongs to a group of medications called oral contraceptives. It is a combination of the hormonesestrogen and progestin. This medicine may be used for other purposes; ask your health care provider orpharmacist if you have questions. COMMON BRAND NAME(S): Aurovela 24 Fe 1/20, Aurovela Fe, Blisovi 24 Fe, Blisovi Fe, Estrostep Fe, Gemmily, Gildess 24 Fe, Gildess Fe 1.5/30, Gildess Fe 1/20, Hailey 24 Fe, Hailey Fe 1.5/30, Junel Fe 1.5/30, Junel Fe 1/20, Junel Fe 24, Larin Fe, Lo Loestrin Fe, Loestrin 24 Fe, Loestrin FE 1.5/30, Loestrin FE 1/20, Lomedia 24 Fe, Merzee, Microgestin 24 Fe, Microgestin Fe 1.5/30, Microgestin Fe1/20, Tarina 24 Fe, Tarina Fe 1/20, Taysofy, Taytulla, Tilia Fe, Tri-Legest Fe What should I tell my care team before I take this medication? They need to know if you have any of these conditions: Abnormal vaginal bleeding Blood vessel disease or blood clots Breast, cervical, endometrial, ovarian, liver, or uterine cancer Diabetes Gallbladder disease Having surgery Heart disease or recent heart attack High blood pressure High cholesterol or triglycerides History of irregular heartbeat or heart valve problems Kidney disease Liver disease Migraine headaches Protein C deficiency Protein S deficiency Recently had a baby, miscarriage, or abortion Stroke Systemic lupus erythematosus (SLE) Tobacco smoker An unusual or allergic reaction to estrogens, progestins, other medications, foods, dyes, or preservatives Pregnant or trying to get pregnant Breast-feeding How should I use this medication? Take this medication by mouth. To reduce nausea, this medication may be taken with  food. Follow the directions on the prescription label. Take this medication at the same time each day and in the order directed on the package.Do not take your medication more often than directed. A patient package insert for the product will be given with each prescription and refill. Read this sheet carefully each time. The sheet may changefrequently. Contact your care team regarding the use of this medication in children. Special care may be needed. This medication has been used in female childrenwho have started having menstrual periods. Overdosage: If you think you have taken too much of this medicine contact apoison control center or emergency room at once. NOTE: This medicine is only for you. Do not share this medicine with others. What if I miss a dose? If you miss a dose, refer to the patient information sheet you received with your medication for direction. If you miss more than one pill, this medicationmay not be as effective, and you may need to use another form of birth control. What may interact with this medication? Do not take this medication with the following: Dasabuvir; ombitasvir; paritaprevir; ritonavir Ombitasvir; paritaprevir; ritonavir This medication may also interact with the following: Acetaminophen Antibiotics or medications for infections, especially rifampin, rifabutin, rifapentine, and griseofulvin, and possibly penicillins or tetracyclines Aprepitant Ascorbic acid (vitamin C) Atorvastatin Barbiturate medications, such as phenobarbital Bosentan Carbamazepine Caffeine Clofibrate Cyclosporine Dantrolene Doxercalciferol Felbamate Grapefruit juice Hydrocortisone Medications for anxiety or sleeping problems, such as diazepam or temazepam Medications for diabetes, including pioglitazone Mineral oil Modafinil Mycophenolate Nefazodone Oxcarbazepine Phenytoin Prednisolone Ritonavir or other medications for HIV infection or AIDS Rosuvastatin Selegiline Soy  isoflavones supplements St. John's wort Tamoxifen or raloxifene Theophylline Thyroid hormones Topiramate Warfarin This list may not describe all possible interactions. Give your health   care provider a list of all the medicines, herbs, non-prescription drugs, or dietary supplements you use. Also tell them if you smoke, drink alcohol, or use illegaldrugs. Some items may interact with your medicine. What should I watch for while using this medication? Visit your care team for regular checks on your progress. You will need aregular breast and pelvic exam and Pap smear while on this medication. Use an additional method of contraception during the first cycle that you takethese tablets. If you have any reason to think you are pregnant, stop taking this medicationright away and contact your care team. If you are taking this medication for hormone related problems, it may takeseveral cycles of use to see improvement in your condition. Smoking increases the risk of getting a blood clot or having a stroke while you are taking birth control pills, especially if you are more than 31 years old.You are strongly advised not to smoke. This medication can make your body retain fluid, making your fingers, hands, or ankles swell. Your blood pressure can go up. Contact your care team if you feelyou are retaining fluid. This medication can make you more sensitive to the sun. Keep out of the sun. If you cannot avoid being in the sun, wear protective clothing and use sunscreen.Do not use sun lamps or tanning beds/booths. If you wear contact lenses and notice visual changes, or if the lenses begin tofeel uncomfortable, consult your eye care specialist. In some women, tenderness, swelling, or minor bleeding of the gums may occur. Notify your dentist if this happens. Brushing and flossing your teeth regularly may help limit this. See your dentist regularly and inform your dentist of themedications you are taking. If you are  going to have elective surgery, you may need to stop taking thismedication before the surgery. Consult your care team for advice. This medication does not protect you against HIV infection (AIDS) or any othersexually transmitted diseases. What side effects may I notice from receiving this medication? Side effects that you should report to your care team as soon as possible: Allergic reactions-skin rash, itching, hives, swelling of the face, lips, tongue, or throat Blood clot-pain, swelling, or warmth in the leg, shortness of breath, chest pain Gallbladder problems-severe stomach pain, nausea, vomiting, fever Increase in blood pressure Liver injury-right upper belly pain, loss of appetite, light-colored stool, dark yellow or brown urine, yellowing skin or eyes, unusual weakness or fatigue New or worsening migraines or headaches Stroke-sudden numbness or weakness of the face, arm or leg, trouble speaking, confusion, trouble walking, loss of balance or coordination, dizziness, severe headache, change in vision Unusual vaginal discharge, itching, or odor Worsening mood, feelings of depression Side effects that usually do not require medical attention (report these toyour care team if they continue or are bothersome): Breast pain or tenderness Dark patches of skin on the face or other sun-exposed areas Irregular menstrual cycles or spotting Nausea Weight gain This list may not describe all possible side effects. Call your doctor for medical advice about side effects. You may report side effects to FDA at1-800-FDA-1088. Where should I keep my medication? Keep out of the reach of children and pets. Store at room temperature between 15 and 30 degrees C (59 and 86 degrees F).Throw away any unused medication after the expiration date. NOTE: This sheet is a summary. It may not cover all possible information. If you have questions about this medicine, talk to your doctor, pharmacist, orhealth care  provider.  2022 Elsevier/Gold Standard (2019-12-01 12:27:45)    you have questions about this medicine,  talk to your doctor, pharmacist, or health care provider.  2022 Elsevier/Gold Standard (2019-12-01 12:27:45)

## 2021-01-26 ENCOUNTER — Other Ambulatory Visit: Payer: Self-pay | Admitting: Obstetrics & Gynecology

## 2021-03-22 ENCOUNTER — Other Ambulatory Visit: Payer: Self-pay

## 2021-03-22 ENCOUNTER — Ambulatory Visit (INDEPENDENT_AMBULATORY_CARE_PROVIDER_SITE_OTHER): Payer: Medicaid Other | Admitting: Obstetrics and Gynecology

## 2021-03-22 ENCOUNTER — Encounter: Payer: Self-pay | Admitting: Obstetrics and Gynecology

## 2021-03-22 VITALS — BP 100/60 | Ht 62.0 in | Wt 140.0 lb

## 2021-03-22 DIAGNOSIS — Z01419 Encounter for gynecological examination (general) (routine) without abnormal findings: Secondary | ICD-10-CM | POA: Diagnosis not present

## 2021-03-22 DIAGNOSIS — F419 Anxiety disorder, unspecified: Secondary | ICD-10-CM | POA: Diagnosis not present

## 2021-03-22 DIAGNOSIS — Z3041 Encounter for surveillance of contraceptive pills: Secondary | ICD-10-CM

## 2021-03-22 DIAGNOSIS — F32A Depression, unspecified: Secondary | ICD-10-CM | POA: Diagnosis not present

## 2021-03-22 MED ORDER — LO LOESTRIN FE 1 MG-10 MCG / 10 MCG PO TABS: 1.0000 | ORAL_TABLET | Freq: Every day | ORAL | 3 refills | Status: AC

## 2021-03-22 MED ORDER — SERTRALINE HCL 50 MG PO TABS: ORAL_TABLET | ORAL | 1 refills | Status: AC

## 2021-03-22 NOTE — Patient Instructions (Signed)
I value your feedback and you entrusting us with your care. If you get a [facility] patient survey, I would appreciate you taking the time to let us know about your experience today. Thank you! ? ? ?

## 2021-03-22 NOTE — Progress Notes (Addendum)
PCP:  Patient, No Pcp Per (Inactive)   Chief Complaint  Patient presents with   Gynecologic Exam    No concerns     HPI:      [name] is a 31 y.o. G1P1001 whose LMP was Patient's last menstrual period was 03/10/2021 (approximate)., presents today for her annual examination.  Her menses are regular every 28-30 days, lasting 7 days.  Dysmenorrhea mild, occurring first 1-2 days of flow. She does not have intermenstrual bleeding.  Sex activity: single partner, contraception - OCP (estrogen/progesterone).  Last Pap: 02/23/20 Results were: no abnormalities /neg HPV DNA   There is no FH of breast cancer. There is no FH of ovarian cancer. The patient does do self-breast exams. There is a FH of pancreatic cancer in her mat aunt and colon cancer in her MGF, genetic testing not indicated for pt by current guidelines, but will continue to follow FH.   Tobacco use: vaped daily Alcohol use: social drinker No drug use.  Exercise: moderately active  She does get adequate calcium but not Vitamin D in her diet.  Pt with PP depression sx a few months. Tried lexapro and felt "high"/had jaw clenching. Changed to wellbutrin 150 mg daily but doesn't feel like it's working. Feels like she has always had depression sx but just dealt with it. FH depression sx but no one seeks tx; sometimes self medicates with substances. No SI. Has not seen therapist.    Patient Active Problem List   Diagnosis Date Noted   Postpartum depression 11/05/2020   Third degree laceration of perineum during delivery, postpartum 10/17/2020    Past Surgical History:  Procedure Laterality Date   NO PAST SURGERIES      Family History  Problem Relation Age of Onset   Pancreatic cancer Maternal Aunt 43   Colon cancer Maternal Grandfather 51    Social History   Socioeconomic History   Marital status: Married    Spouse name: Not on file   Number of children: Not on file   Years of education: Not on file    Highest education level: Not on file  Occupational History   Not on file  Tobacco Use   Smoking status: Every Day    Types: E-cigarettes   Smokeless tobacco: Never  Vaping Use   Vaping Use: Every day  Substance and Sexual Activity   Alcohol use: Yes   Drug use: Not Currently   Sexual activity: Yes    Birth control/protection: Pill  Other Topics Concern   Not on file  Social History Narrative   Not on file   Social Determinants of Health   Financial Resource Strain: Not on file  Food Insecurity: Not on file  Transportation Needs: Not on file  Physical Activity: Not on file  Stress: Not on file  Social Connections: Not on file  Intimate Partner Violence: Not on file     Current Outpatient Medications:    buPROPion (WELLBUTRIN XL) 150 MG 24 hr tablet, Take 1 tablet (150 mg total) by mouth daily., Disp: 30 tablet, Rfl: 6   sertraline (ZOLOFT) 50 MG tablet, Take 1/2 tab daily for 6 days, then 1 tab daily, Disp: 30 tablet, Rfl: 1   Norethindrone-Ethinyl Estradiol-Fe Biphas (LO LOESTRIN FE) 1 MG-10 MCG / 10 MCG tablet, Take 1 tablet by mouth daily., Disp: 84 tablet, Rfl: 3    ROS:  Review of Systems  Constitutional:  Negative for fatigue, fever and unexpected weight change.  Respiratory:  Negative for cough, shortness of breath and wheezing.   Cardiovascular:  Negative for chest pain, palpitations and leg swelling.  Gastrointestinal:  Negative for blood in stool, constipation, diarrhea, nausea and vomiting.  Endocrine: Negative for cold intolerance, heat intolerance and polyuria.  Genitourinary:  Negative for dyspareunia, dysuria, flank pain, frequency, genital sores, hematuria, menstrual problem, pelvic pain, urgency, vaginal bleeding, vaginal discharge and vaginal pain.  Musculoskeletal:  Negative for back pain, joint swelling and myalgias.  Skin:  Negative for rash.  Neurological:  Negative for dizziness, syncope, light-headedness, numbness and headaches.  Hematological:   Negative for adenopathy.  Psychiatric/Behavioral:  Positive for agitation and dysphoric mood. Negative for confusion, sleep disturbance and suicidal ideas. The patient is not nervous/anxious.   BREAST: No symptoms   Objective: BP 100/60    Ht 5\' 2"  (1.575 m)    Wt 140 lb (63.5 kg)    LMP 03/10/2021 (Approximate)    Breastfeeding No    BMI 25.61 kg/m    Physical Exam Constitutional:      Appearance: She is well-developed.  Genitourinary:     Vulva normal.     Right Labia: No rash, tenderness or lesions.    Left Labia: No tenderness, lesions or rash.    No vaginal discharge, erythema or tenderness.      Right Adnexa: not tender and no mass present.    Left Adnexa: not tender and no mass present.    No cervical friability or polyp.     Uterus is not enlarged or tender.  Breasts:    Right: No mass, nipple discharge, skin change or tenderness.     Left: No mass, nipple discharge, skin change or tenderness.  Neck:     Thyroid: No thyromegaly.  Cardiovascular:     Rate and Rhythm: Normal rate and regular rhythm.     Heart sounds: Normal heart sounds. No murmur heard. Pulmonary:     Effort: Pulmonary effort is normal.     Breath sounds: Normal breath sounds.  Abdominal:     Palpations: Abdomen is soft.     Tenderness: There is no abdominal tenderness. There is no guarding or rebound.  Musculoskeletal:        General: Normal range of motion.     Cervical back: Normal range of motion.  Lymphadenopathy:     Cervical: No cervical adenopathy.  Neurological:     General: No focal deficit present.     Mental Status: She is alert and oriented to person, place, and time.     Cranial Nerves: No cranial nerve deficit.  Skin:    General: Skin is warm and dry.  Psychiatric:        Mood and Affect: Mood normal.        Behavior: Behavior normal.        Thought Content: Thought content normal.        Judgment: Judgment normal.  Vitals reviewed.    Results: GAD 7 : Generalized  Anxiety Score 03/22/2021  Nervous, Anxious, on Edge 1  Control/stop worrying 2  Worry too much - different things 2  Trouble relaxing 3  Restless 0  Easily annoyed or irritable 3  Afraid - awful might happen 1  Total GAD 7 Score 12  Anxiety Difficulty Somewhat difficult    Depression screen PHQ 2/9 03/22/2021  Decreased Interest 1  Down, Depressed, Hopeless 2  PHQ - 2 Score 3  Altered sleeping 3  Tired, decreased energy 3  Change in appetite 2  Feeling bad or failure about yourself  2  Trouble concentrating 1  Moving slowly or fidgety/restless 0  Suicidal thoughts 0  PHQ-9 Score 14  Difficult doing work/chores Somewhat difficult     Assessment/Plan: Encounter for annual routine gynecological examination  Encounter for surveillance of contraceptive pills - Plan: Norethindrone-Ethinyl Estradiol-Fe Biphas (LO LOESTRIN FE) 1 MG-10 MCG / 10 MCG tablet; OCP RF.   Anxiety and depression - Plan: sertraline (ZOLOFT) 50 MG tablet; wean off wellbutrin; try zoloft. Didn't do well with lexapro but will see how tolerates zoloft. Rx eRxd. RTO in 7 wks for f/u  sx f/u, sooner prn. Recommended seeing therapist.   Meds ordered this encounter  Medications   Norethindrone-Ethinyl Estradiol-Fe Biphas (LO LOESTRIN FE) 1 MG-10 MCG / 10 MCG tablet    Sig: Take 1 tablet by mouth daily.    Dispense:  84 tablet    Refill:  3    Order Specific Question:   Supervising Provider    Answer:   Gae Dry [id number]   sertraline (ZOLOFT) 50 MG tablet    Sig: Take 1/2 tab daily for 6 days, then 1 tab daily    Dispense:  30 tablet    Refill:  1    Order Specific Question:   Supervising Provider    Answer:   Gae Dry [id number]             GYN counsel adequate intake of calcium and vitamin D, diet and exercise     F/U  Return in about 7 weeks (around 05/10/2021) for anxiety/depression f/u.  Hillery Bhalla B. Fount Bahe, PA-C 03/22/2021 2:21 PM

## 2021-05-09 NOTE — Progress Notes (Deleted)
    Patient, No Pcp Per (Inactive)   No chief complaint on file.   HPI:      [name] is a 31 y.o. G1P1001 whose LMP was No LMP recorded., presents today for ***  Pt with PP depression sx a few months. Tried lexapro and felt "high"/had jaw clenching. Changed to wellbutrin 150 mg daily but doesn't feel like it's working. Feels like she has always had depression sx but just dealt with it. FH depression sx but no one seeks tx; sometimes self medicates with substances. No SI. Has not seen therapist.   Patient Active Problem List   Diagnosis Date Noted   Postpartum depression 11/05/2020   Third degree laceration of perineum during delivery, postpartum 10/17/2020    Past Surgical History:  Procedure Laterality Date   NO PAST SURGERIES      Family History  Problem Relation Age of Onset   Pancreatic cancer Maternal Aunt 9   Colon cancer Maternal Grandfather 43    Social History   Socioeconomic History   Marital status: Married    Spouse name: Not on file   Number of children: Not on file   Years of education: Not on file   Highest education level: Not on file  Occupational History   Not on file  Tobacco Use   Smoking status: Every Day    Types: E-cigarettes   Smokeless tobacco: Never  Vaping Use   Vaping Use: Every day  Substance and Sexual Activity   Alcohol use: Yes   Drug use: Not Currently   Sexual activity: Yes    Birth control/protection: Pill  Other Topics Concern   Not on file  Social History Narrative   Not on file   Social Determinants of Health   Financial Resource Strain: Not on file  Food Insecurity: Not on file  Transportation Needs: Not on file  Physical Activity: Not on file  Stress: Not on file  Social Connections: Not on file  Intimate Partner Violence: Not on file    Outpatient Medications Prior to Visit  Medication Sig Dispense Refill   buPROPion (WELLBUTRIN XL) 150 MG 24 hr tablet Take 1 tablet (150 mg total) by mouth daily. 30  tablet 6   Norethindrone-Ethinyl Estradiol-Fe Biphas (LO LOESTRIN FE) 1 MG-10 MCG / 10 MCG tablet Take 1 tablet by mouth daily. 84 tablet 3   sertraline (ZOLOFT) 50 MG tablet Take 1/2 tab daily for 6 days, then 1 tab daily 30 tablet 1   No facility-administered medications prior to visit.      ROS:  Review of Systems BREAST: No symptoms   OBJECTIVE:   Vitals:  There were no vitals taken for this visit.  Physical Exam  Results: No results found for this or any previous visit (from the past 24 hour(s)).   Assessment/Plan: No diagnosis found.    No orders of the defined types were placed in this encounter.     No follow-ups on file.  Bunny Lowdermilk B. Jady Braggs, PA-C 05/09/2021 2:47 PM

## 2021-05-10 ENCOUNTER — Ambulatory Visit: Payer: Medicaid Other | Admitting: Obstetrics and Gynecology

## 2022-11-05 IMAGING — US US OB COMP +14 WK
1 series · 15 of 28 positions shown · non-contrast
Comparison: none

CLINICAL DATA: 30-year-old pregnant female presents for fetal
anatomic survey.

EXAM:
OBSTETRICAL ULTRASOUND >14 WKS

[Series 1: us ob comp + 14 wk · 15 of 90 slices shown]
[im 1/90]
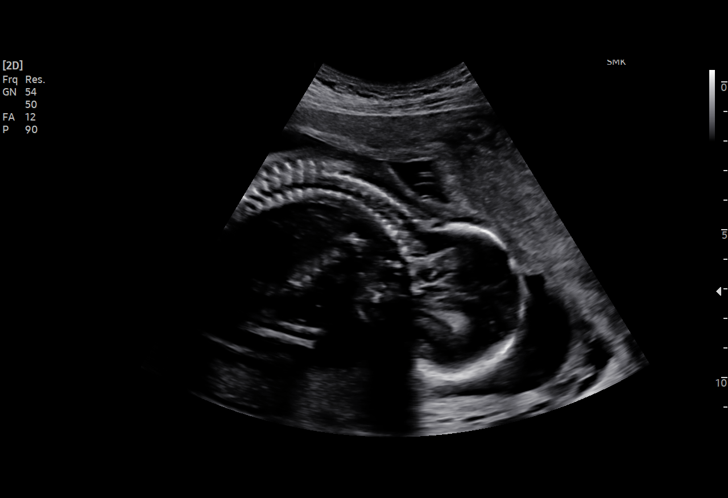
[im 7/90]
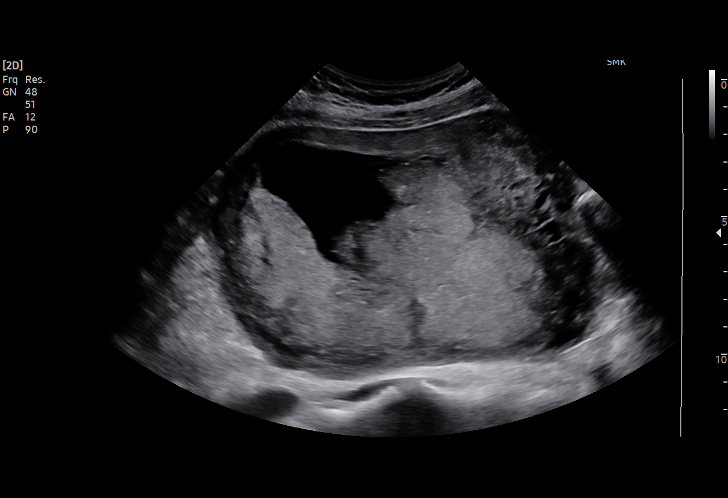
[im 14/90]
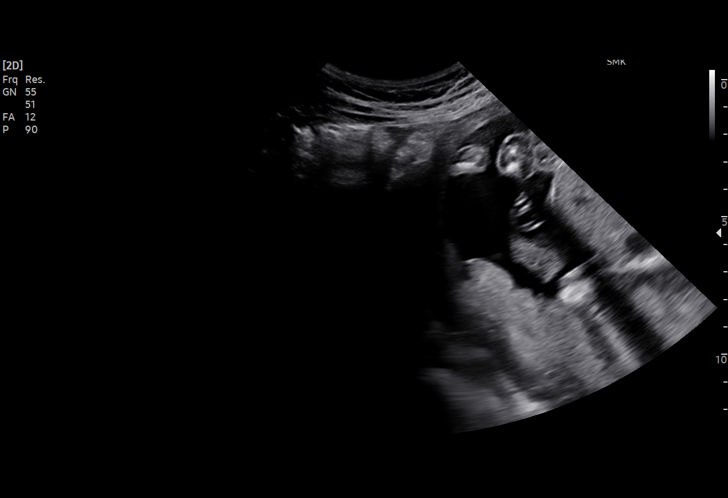
[im 20/90]
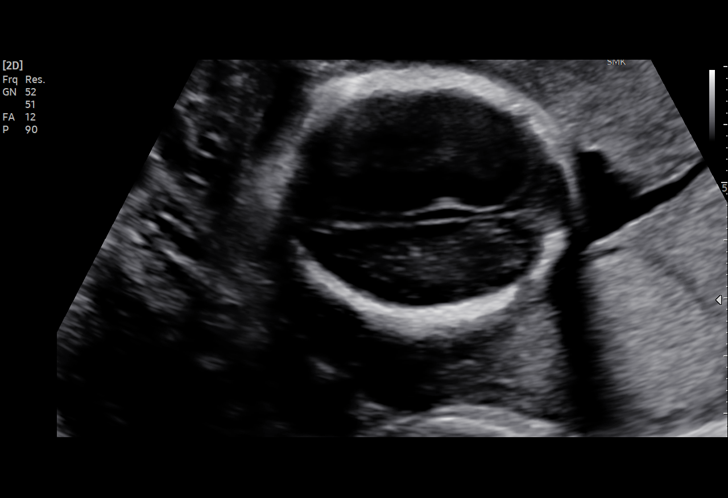
[im 27/90]
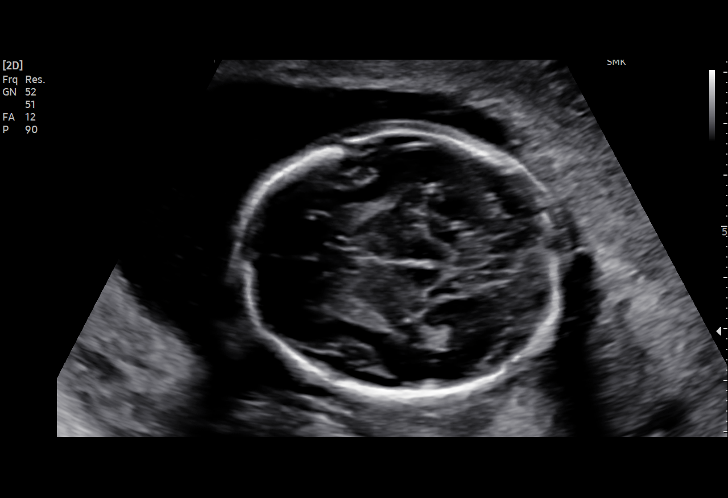
[im 33/90]
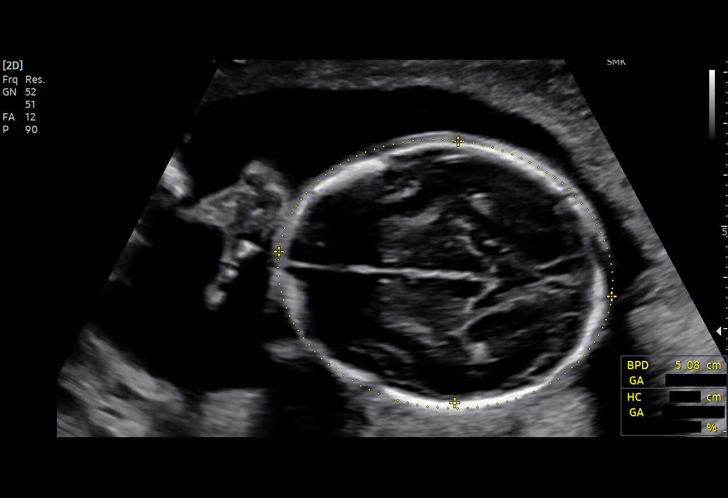
[im 40/90]
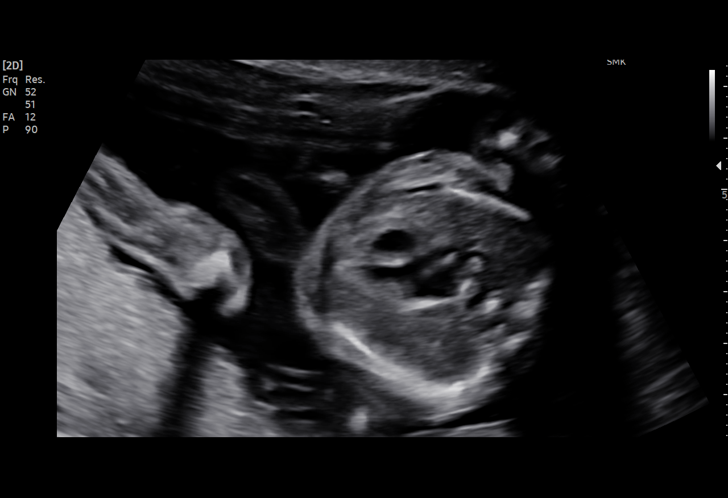
[im 47/90]
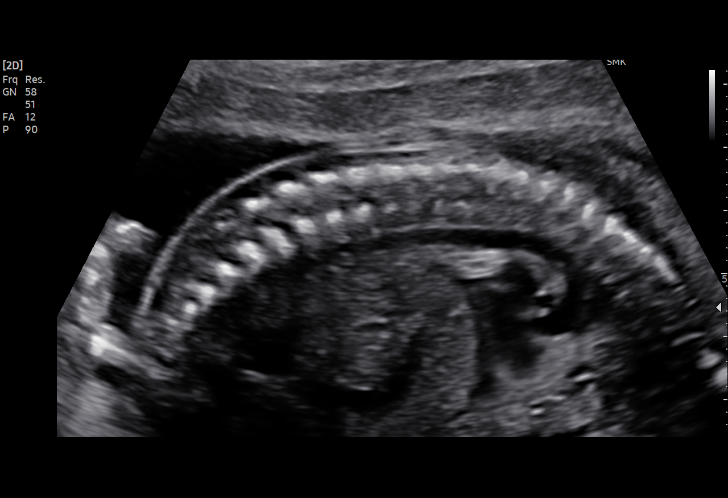
[im 50/90]
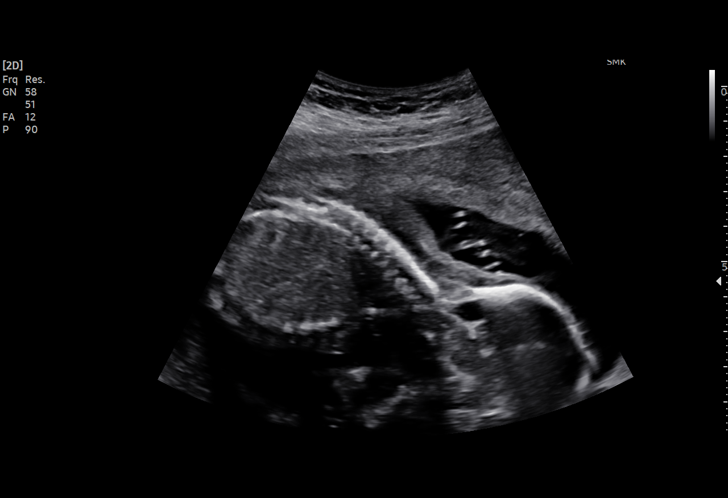
[im 57/90]
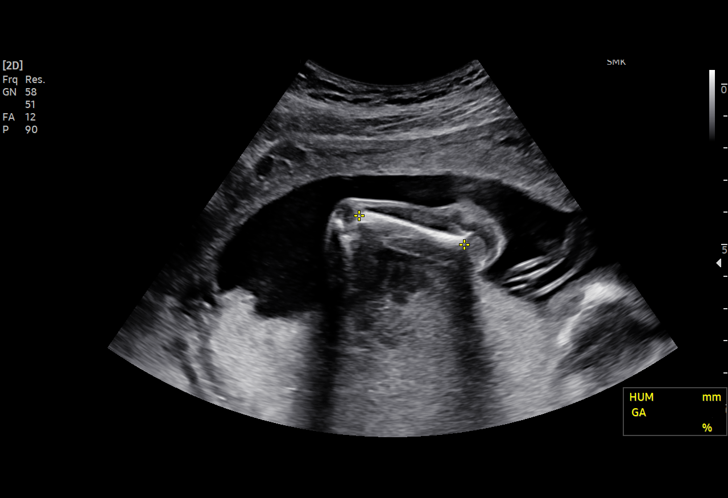
[im 63/90]
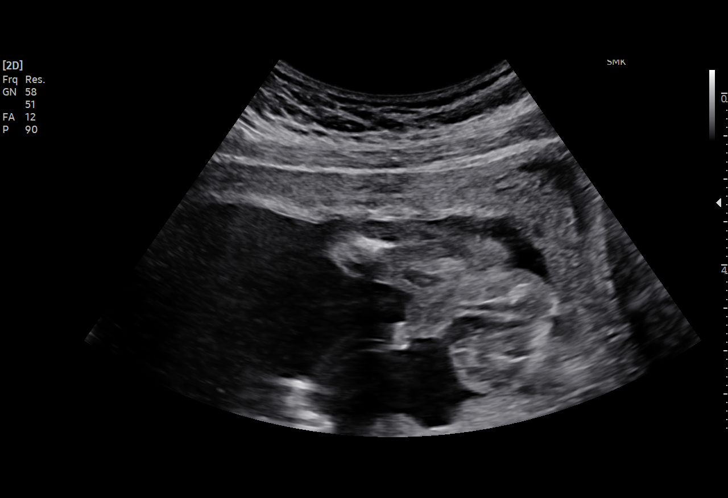
[im 70/90]
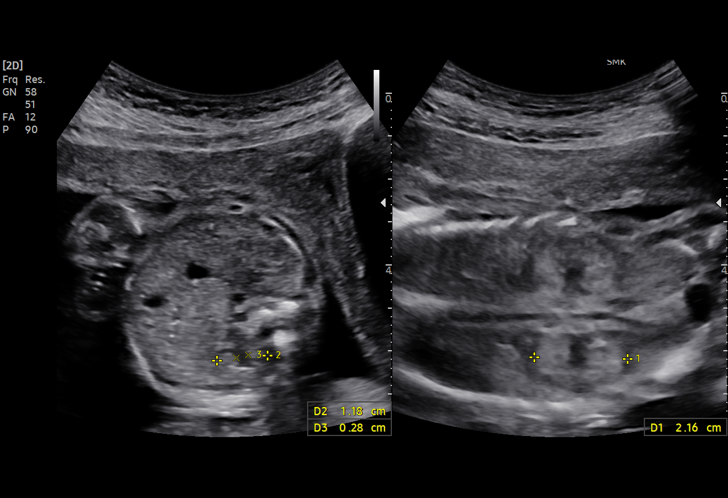
[im 76/90]
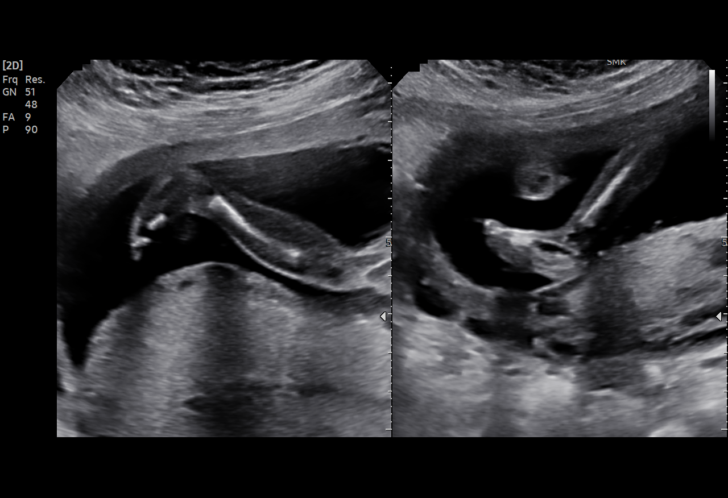
[im 83/90]
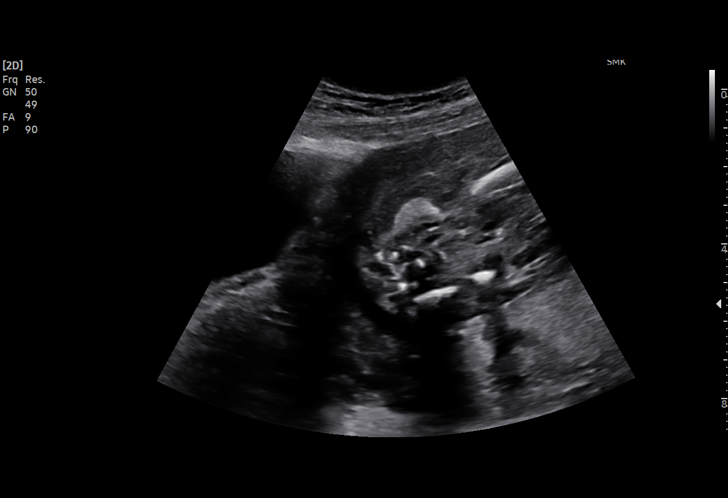
[im 90/90]
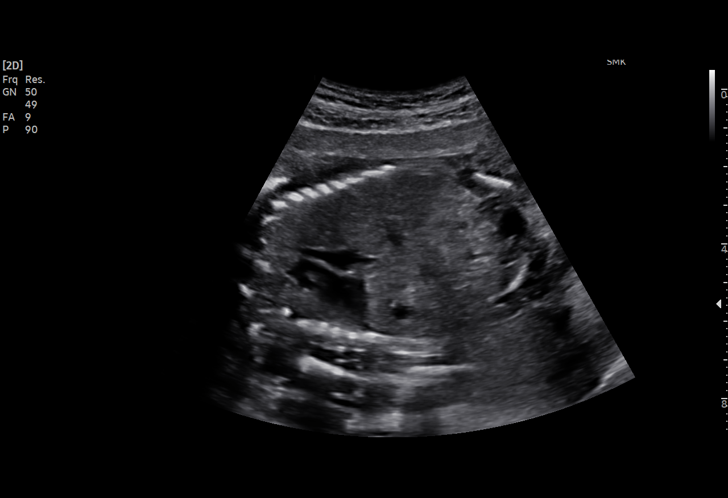

[15 of 28 positions shown; findings below may reference images not displayed]

FINDINGS: Number of Fetuses: 1

Heart Rate:  158 bpm

Movement: Yes

Presentation: Cephalic

Previa: No

Placental Location: Posterior

Amniotic Fluid (Subjective): Normal

Amniotic Fluid (Objective):

Vertical pocket = 5.0cm

FETAL BIOMETRY

BPD: 5.1cm 21w 3d

HC:   18.5cm 20w 6d

AC:   17.5cm 22w 3d

FL:   3.3cm 20w 2d

Current Mean GA: 21w 2d US EDC: 10/11/2020

Assigned GA:  20w 6d Assigned EDC: 10/14/2020

Estimated Fetal Weight:  420g

FETAL ANATOMY

Lateral Ventricles: Appears normal

Thalami/CSP: Appears normal

Posterior Fossa:  Appears normal

Nuchal Region: Appears normal   NFT= 3.0 mm

Upper Lip: Appears normal

Spine: Appears normal

4 Chamber Heart on Left: Appears normal

LVOT: Appears normal

RVOT: Appears normal

Stomach on Left: Appears normal

3 Vessel Cord: Appears normal

Cord Insertion site: Appears normal

Kidneys: Appears normal

Bladder: Appears normal

Extremities: Appears normal, 4 extremities demonstrated

Sex: Male external genitalia

Maternal Findings:

Cervix: Cervix length approximately 4.5 cm on transabdominal views
with no evidence of internal cervical funneling.
IMPRESSION: 1. Single living intrauterine gestation in cephalic lie at 21 weeks
2 days by average ultrasound age, concordant with expected
gestational age by assigned dating.
2. No fetal or maternal abnormalities detected.
